# Patient Record
Sex: Female | Born: 1990 | Race: White | Hispanic: Yes | Marital: Single | Smoking: Never smoker
Health system: Southern US, Community
[De-identification: ages and names within clinical notes are randomized; demographics above are authoritative.]

## PROBLEM LIST (undated history)

## (undated) ENCOUNTER — Emergency Department: Discharge status: Absent without leave | Payer: BLUE CROSS/BLUE SHIELD

## (undated) DIAGNOSIS — D696 Thrombocytopenia, unspecified: Secondary | ICD-10-CM

## (undated) DIAGNOSIS — J45909 Unspecified asthma, uncomplicated: Secondary | ICD-10-CM

## (undated) HISTORY — DX: Thrombocytopenia, unspecified: D69.6

## (undated) HISTORY — PX: CYST REMOVAL HAND: SHX6279

## (undated) HISTORY — DX: Unspecified asthma, uncomplicated: J45.909

---

## 2013-10-22 ENCOUNTER — Encounter (HOSPITAL_COMMUNITY): Payer: Self-pay | Admitting: Emergency Medicine

## 2013-10-22 ENCOUNTER — Emergency Department (HOSPITAL_COMMUNITY)
Admission: EM | Admit: 2013-10-22 | Discharge: 2013-10-23 | Disposition: A | Discharge status: Home / Self Care | Attending: Emergency Medicine | Admitting: Emergency Medicine

## 2013-10-22 DIAGNOSIS — R51 Headache: Secondary | ICD-10-CM | POA: Diagnosis present

## 2013-10-22 DIAGNOSIS — Z880 Allergy status to penicillin: Secondary | ICD-10-CM | POA: Diagnosis not present

## 2013-10-22 DIAGNOSIS — G44039 Episodic paroxysmal hemicrania, not intractable: Secondary | ICD-10-CM | POA: Diagnosis not present

## 2013-10-22 MED ORDER — DIPHENHYDRAMINE HCL 50 MG/ML IJ SOLN
25.0000 mg | Freq: Once | INTRAMUSCULAR | Status: AC
  Administered 2013-10-23: 25 mg via INTRAMUSCULAR
  Filled 2013-10-22: qty 1

## 2013-10-22 MED ORDER — DEXAMETHASONE SODIUM PHOSPHATE 10 MG/ML IJ SOLN
10.0000 mg | Freq: Once | INTRAMUSCULAR | Status: AC
  Administered 2013-10-22: 10 mg via INTRAMUSCULAR
  Filled 2013-10-22: qty 1

## 2013-10-22 MED ORDER — METOCLOPRAMIDE HCL 5 MG/ML IJ SOLN
10.0000 mg | Freq: Once | INTRAMUSCULAR | Status: AC
  Administered 2013-10-22: 10 mg via INTRAMUSCULAR
  Filled 2013-10-22: qty 2

## 2013-10-22 NOTE — ED Provider Notes (Signed)
CSN: [id number]     Arrival date & time 10/22/13  2216 History   First MD Initiated Contact with Patient 10/22/13 2334     Chief Complaint  Patient presents with  . Headache     (Consider location/radiation/quality/duration/timing/severity/associated sxs/prior Treatment) HPI Comments: Patient presents with a headache. She states that 2 days ago someone was spraying Clorox next to her and some of the spray blew over onto her face. She states that she had some minor eye irritation but denies any current eye irritation. She denies any blurry vision, redness or drainage from her eyes. She complains of a constant throbbing headache since that time. It's across the front part of her head. She has had some similar pain with migraines in the past. She denies any nausea or vomiting. She denies any cough or congestion. She denies any dizziness. She has no numbness weakness or gait disturbance. She has no neck pain or fevers.  Patient is a 23 y.o. female presenting with headaches.  Headache Associated symptoms: no abdominal pain, no back pain, no congestion, no cough, no diarrhea, no dizziness, no pain, no fatigue, no fever, no nausea, no numbness, no photophobia and no vomiting     History reviewed. No pertinent past medical history. History reviewed. No pertinent past surgical history. No family history on file. History  Substance Use Topics  . Smoking status: Never Smoker   . Smokeless tobacco: Not on file  . Alcohol Use: Yes   OB History   Grav Para Term Preterm Abortions TAB SAB Ect Mult Living                 Review of Systems  Constitutional: Negative for fever, chills, diaphoresis and fatigue.  HENT: Negative for congestion, rhinorrhea and sneezing.   Eyes: Negative.  Negative for photophobia, pain, discharge, redness, itching and visual disturbance.  Respiratory: Negative for cough, chest tightness and shortness of breath.   Cardiovascular: Negative for chest pain and leg swelling.   Gastrointestinal: Negative for nausea, vomiting, abdominal pain, diarrhea and blood in stool.  Genitourinary: Negative for frequency, hematuria, flank pain and difficulty urinating.  Musculoskeletal: Negative for arthralgias and back pain.  Skin: Negative for rash.  Neurological: Positive for headaches. Negative for dizziness, speech difficulty, weakness and numbness.      Allergies  Penicillins  Home Medications   Prior to Admission medications   Not on File   BP 113/71  Pulse 76  Temp(Src) 98.3 F (36.8 C)  Resp 18  Ht  (1.448 m)  Wt 120 lb (54.432 kg)  BMI 25.96 kg/m2  SpO2 100% Physical Exam  Constitutional: She is oriented to person, place, and time. She appears well-developed and well-nourished.  HENT:  Head: Normocephalic and atraumatic.  Right Ear: External ear normal.  Left Ear: External ear normal.  Mouth/Throat: Oropharynx is clear and moist.  No erythema or rashes to the skin of her face  Eyes: Conjunctivae and EOM are normal. Pupils are equal, round, and reactive to light.  No eye redness or irritation. There is no drainage.  Neck: Normal range of motion. Neck supple.  No meningeal signs  Cardiovascular: Normal rate, regular rhythm and normal heart sounds.   Pulmonary/Chest: Effort normal and breath sounds normal. No respiratory distress. She has no wheezes. She has no rales. She exhibits no tenderness.  Abdominal: Soft. Bowel sounds are normal. There is no tenderness. There is no rebound and no guarding.  Musculoskeletal: Normal range of motion. She exhibits no edema.  Lymphadenopathy:    She has no cervical adenopathy.  Neurological: She is alert and oriented to person, place, and time. She has normal strength. No cranial nerve deficit or sensory deficit. GCS eye subscore is 4. GCS verbal subscore is 5. GCS motor subscore is 6.  Skin: Skin is warm and dry. No rash noted.  Psychiatric: She has a normal mood and affect.    ED Course  Procedures  (including critical care time) Labs Review Labs Reviewed - No data to display  Imaging Review No results found.   EKG Interpretation None      MDM   Final diagnoses:  Nonintractable paroxysmal hemicrania, unspecified chronicity pattern   Patient presents with a headache after having a Clorox exposure. She is well-appearing, sitting up on the side of the bed. She's in no apparent distress. I don't see any evidence of eye injury. There is no facial burns noted. Her headache seems consistent with her past migraines type headaches. There is no unusual symptoms that would be more suggestive of meningitis or subarachnoid hemorrhage. She was given a migraine cocktail. She is advised to return if her symptoms worsen.    Rolan Bucco, MD 10/22/13 [phone]

## 2013-10-22 NOTE — ED Notes (Signed)
The pt is c/o a headache for 2 days  She had clorox sprayed in her  vacinity while she was working she has had aheadache since then .  Hx of migraine headaches

## 2014-03-03 NOTE — L&D Delivery Note (Signed)
Operative Delivery Note At 9:04 AM a viable and healthy female was delivered via Vaginal, Vac[employer], operational).  Presentation: vertex; Position: Occiput,, Anterior; Station: +3.  Verbal consent: obtained from patient.  Risks and benefits discussed in detail.  Risks include, but are not limited to the risks of anesthesia, bleeding, infection, damage to maternal tissues, fetal cephalhematoma.  There is also the risk of inability to effect vaginal delivery of the head, or shoulder dystocia that cannot be resolved by established maneuvers, leading to the need for emergency cesarean section.  APGAR: 8, 9; weight 8 lb 1.6 oz (3675 g).   Placenta status: Intact, Manual removal.  Cord avulsed Cord: 3 vessels with the following complications: None.    Anesthesia: Epidural  Instruments: Kiwi Episiotomy: None Lacerations: 2nd degree Suture Repair: 3.0 vicryl rapide Est. Blood Loss (mL): 173cc  Mom to postpartum.  Baby to Couplet care / Skin to Skin.  Bovard-Stuckert, Bridget Hodges 09/28/2014, 9:30 AM  Desires circumcision for female infant including r/b/a - will proceed in office  Br/RI/A+/Tdap in PNC/Contra - Nexplanon

## 2014-04-07 LAB — OB RESULTS CONSOLE ANTIBODY SCREEN: Antibody Screen: NEGATIVE

## 2014-04-07 LAB — OB RESULTS CONSOLE GC/CHLAMYDIA
CHLAMYDIA, DNA PROBE: NEGATIVE
GC PROBE AMP, GENITAL: NEGATIVE

## 2014-04-07 LAB — OB RESULTS CONSOLE ABO/RH: RH Type: POSITIVE

## 2014-04-07 LAB — OB RESULTS CONSOLE RPR: RPR: NONREACTIVE

## 2014-04-07 LAB — OB RESULTS CONSOLE HEPATITIS B SURFACE ANTIGEN: Hepatitis B Surface Ag: NEGATIVE

## 2014-04-07 LAB — OB RESULTS CONSOLE HIV ANTIBODY (ROUTINE TESTING): HIV: NONREACTIVE

## 2014-04-07 LAB — OB RESULTS CONSOLE RUBELLA ANTIBODY, IGM: Rubella: IMMUNE

## 2014-05-28 ENCOUNTER — Emergency Department (HOSPITAL_COMMUNITY): Payer: Medicaid Other

## 2014-05-28 ENCOUNTER — Encounter (HOSPITAL_COMMUNITY): Payer: Self-pay | Admitting: *Deleted

## 2014-05-28 ENCOUNTER — Emergency Department (HOSPITAL_COMMUNITY)
Admission: EM | Admit: 2014-05-28 | Discharge: 2014-05-28 | Disposition: A | Discharge status: Home / Self Care | Payer: Medicaid Other | Attending: Emergency Medicine | Admitting: Emergency Medicine

## 2014-05-28 DIAGNOSIS — O468X2 Other antepartum hemorrhage, second trimester: Secondary | ICD-10-CM | POA: Diagnosis present

## 2014-05-28 DIAGNOSIS — Z880 Allergy status to penicillin: Secondary | ICD-10-CM | POA: Insufficient documentation

## 2014-05-28 DIAGNOSIS — O4692 Antepartum hemorrhage, unspecified, second trimester: Secondary | ICD-10-CM

## 2014-05-28 DIAGNOSIS — O200 Threatened abortion: Secondary | ICD-10-CM | POA: Insufficient documentation

## 2014-05-28 DIAGNOSIS — Z3A23 23 weeks gestation of pregnancy: Secondary | ICD-10-CM | POA: Diagnosis not present

## 2014-05-28 LAB — CBC WITH DIFFERENTIAL/PLATELET
BASOS PCT: 0 % (ref 0–1)
Basophils Absolute: 0 10*3/uL (ref 0.0–0.1)
Eosinophils Absolute: 0.4 10*3/uL (ref 0.0–0.7)
Eosinophils Relative: 4 % (ref 0–5)
HEMATOCRIT: 33.9 % — AB (ref 36.0–46.0)
Hemoglobin: 11.6 g/dL — ABNORMAL LOW (ref 12.0–15.0)
Lymphocytes Relative: 22 % (ref 12–46)
Lymphs Abs: 2.1 10*3/uL (ref 0.7–4.0)
MCH: 30.8 pg (ref 26.0–34.0)
MCHC: 34.2 g/dL (ref 30.0–36.0)
MCV: 89.9 fL (ref 78.0–100.0)
Monocytes Absolute: 0.7 10*3/uL (ref 0.1–1.0)
Monocytes Relative: 7 % (ref 3–12)
NEUTROS ABS: 6.3 10*3/uL (ref 1.7–7.7)
Neutrophils Relative %: 66 % (ref 43–77)
Platelets: 94 10*3/uL — ABNORMAL LOW (ref 150–400)
RBC: 3.77 MIL/uL — ABNORMAL LOW (ref 3.87–5.11)
RDW: 13.1 % (ref 11.5–15.5)
WBC: 9.5 10*3/uL (ref 4.0–10.5)

## 2014-05-28 LAB — SAMPLE TO BLOOD BANK

## 2014-05-28 LAB — COMPREHENSIVE METABOLIC PANEL
ALBUMIN: 3.1 g/dL — AB (ref 3.5–5.2)
ALT: 20 U/L (ref 0–35)
ANION GAP: 7 (ref 5–15)
AST: 21 U/L (ref 0–37)
Alkaline Phosphatase: 38 U/L — ABNORMAL LOW (ref 39–117)
BUN: 6 mg/dL (ref 6–23)
CHLORIDE: 105 mmol/L (ref 96–112)
CO2: 23 mmol/L (ref 19–32)
CREATININE: 0.45 mg/dL — AB (ref 0.50–1.10)
Calcium: 8.5 mg/dL (ref 8.4–10.5)
Glucose, Bld: 86 mg/dL (ref 70–99)
Potassium: 3.6 mmol/L (ref 3.5–5.1)
SODIUM: 135 mmol/L (ref 135–145)
TOTAL PROTEIN: 6.1 g/dL (ref 6.0–8.3)
Total Bilirubin: 0.3 mg/dL (ref 0.3–1.2)

## 2014-05-28 LAB — ABO/RH: ABO/RH(D): A POS

## 2014-05-28 LAB — FIBRINOGEN: Fibrinogen: 353 mg/dL (ref 204–475)

## 2014-05-28 LAB — LACTATE DEHYDROGENASE: LDH: 136 U/L (ref 94–250)

## 2014-05-28 LAB — PROTIME-INR
INR: 0.97 (ref 0.00–1.49)
PROTHROMBIN TIME: 13 s (ref 11.6–15.2)

## 2014-05-28 NOTE — Progress Notes (Signed)
To u/s, monitors removed

## 2014-05-28 NOTE — Discharge Instructions (Signed)
Vaginal Bleeding During Pregnancy, Second Trimester Bridget Hodges, your ultrasound results are below. See your OB/GYN physician within 3 days for follow-up. If symptoms worsen come back to the emergency department immediately. Thank you.  IMPRESSION: 1. Single live intrauterine pregnancy in breech presentation. Estimated gestational age [redacted] weeks 6 days for estimated date of delivery 09/25/2014. 2. Minimal fluid in the cervical canal with funneling of the cervix at 2.8 cm.    A small amount of bleeding (spotting) from the vagina is common in pregnancy. Sometimes the bleeding is normal and is not a problem, and sometimes it is a sign of something serious. Be sure to tell your doctor about any bleeding from your vagina right away. HOME CARE  Watch your condition for any changes.  Follow your doctor's instructions about how active you can be.  If you are on bed rest:  You may need to stay in bed and only get up to use the bathroom.  You may be allowed to do some activities.  If you need help, make plans for someone to help you.  Write down:  The number of pads you use each day.  How often you change pads.  How soaked (saturated) your pads are.  Do not use tampons.  Do not douche.  Do not have sex or orgasms until your doctor says it is okay.  If you pass any tissue from your vagina, save the tissue so you can show it to your doctor.  Only take medicines as told by your doctor.  Do not take aspirin because it can make you bleed.  Do not exercise, lift heavy weights, or do any activities that take a lot of energy and effort unless your doctor says it is okay.  Keep all follow-up visits as told by your doctor. GET HELP IF:   You bleed from your vagina.  You have cramps.  You have labor pains.  You have a fever that does not go away after you take medicine. GET HELP RIGHT AWAY IF:  You have very bad cramps in your back or belly (abdomen).  You have  contractions.  You have chills.  You pass large clots or tissue from your vagina.  You bleed more.  You feel light-headed or weak.  You pass out (faint).  You are leaking fluid or have a gush of fluid from your vagina. MAKE SURE YOU:  Understand these instructions.  Will watch your condition.  Will get help right away if you are not doing well or get worse. Document Released: 07/04/2013 Document Reviewed: 10/25/2012 [facility] Patient Information 2015 [place], [place]. This information is not intended to replace advice given to you by your health care provider. Make sure you discuss any questions you have with your health care provider.

## 2014-05-28 NOTE — Progress Notes (Signed)
Awaiting pt's return from u/s. Phone call to Dr Jackelyn KnifeMeisinger. Report given re: bleeding, cramping and u/s results. Pt is cleared obstetrically, call f/u with phone call to office on Monday or sooner PRN.

## 2014-05-28 NOTE — Progress Notes (Signed)
Pt to [facility] after BRB in toilet when urinating at about 1:00 am. Pt states it was more than spotting but less than a period flow. Pt reports some cramping over the past few days, but nothing that she could time, just here and there. Pt reports intercourse last night before bed. Denies LOF. Plan is for u/s and SVE by ED MD. Pt laying in bed in no apparent distress with no further vaginal bleeding.

## 2014-05-28 NOTE — ED Notes (Signed)
The pt  Has an ob doctor .Marland Kitchen. They have dopplered a heart beat many times.  The pts other pregnancy  Ended in an abortion

## 2014-05-28 NOTE — ED Provider Notes (Signed)
CSN: [id number]     Arrival date & time 05/28/14  0220 History  This chart was scribed for Bridget Crumble, MD by Tanda Rockers, ED Scribe. This patient was seen in room B16C/B16C and the patient's care was started at 3:24 AM.    Chief Complaint  Patient presents with  . Vaginal Bleeding   The history is provided by the patient. No language interpreter was used.     HPI Comments: Bridget Hodges is a 24 y.o. female who is 5 months pregnant presents to the Emergency Department complaining of vaginal bleeding that began earlier today around 1 AM. Pt states that when she urinated, she noticed blood in the toilet that was bright red. Pt was told by OBGYN to call him if she had bright red vaginal bleeding. Pt attempted to call OBGYN but he did not answer. Pt reports she feels as though she is having discharge but denies any sudden rush of fluid. She also complains of mild abdominal cramping and nausea. She had her first ultrasound on 05/01/14 which she states was normal. Pt has 2nd ultrasound appointment on 06/05/14. Pt mentions that she did have blood work done and notes that her platelets were low. She denies painful leg swelling, fever, chills, or any other symptoms.     History reviewed. No pertinent past medical history. History reviewed. No pertinent past surgical history. No family history on file. History  Substance Use Topics  . Smoking status: Never Smoker   . Smokeless tobacco: Not on file  . Alcohol Use: Yes   OB History    Gravida Para Term Preterm AB TAB SAB Ectopic Multiple Living   1              Review of Systems  10 Systems reviewed and all are negative for acute change except as noted in the HPI.    Allergies  Penicillins  Home Medications   Prior to Admission medications   Not on File   Triage Vitals: BP 98/72 mmHg  Pulse 76  Temp(Src) 97.7 F (36.5 C)  Resp 20  Ht  (1.448 m)  Wt 129 lb (58.514 kg)  BMI 27.91 kg/m2  SpO2 98%   Physical Exam   Constitutional: She is oriented to person, place, and time. She appears well-developed and well-nourished. No distress.  HENT:  Head: Normocephalic and atraumatic.  Nose: Nose normal.  Mouth/Throat: Oropharynx is clear and moist. No oropharyngeal exudate.  Eyes: Conjunctivae and EOM are normal. Pupils are equal, round, and reactive to light. No scleral icterus.  Neck: Normal range of motion. Neck supple. No JVD present. No tracheal deviation present. No thyromegaly present.  Cardiovascular: Normal rate, regular rhythm and normal heart sounds.  Exam reveals no gallop and no friction rub.   No murmur heard. Pulmonary/Chest: Effort normal and breath sounds normal. No respiratory distress. She has no wheezes. She exhibits no tenderness.  Abdominal: Soft. Bowel sounds are normal. She exhibits no distension and no mass. There is tenderness (Suprapubic tenderness to palpation. ). There is no rebound and no guarding.  Musculoskeletal: Normal range of motion. She exhibits no edema or tenderness.  Lymphadenopathy:    She has no cervical adenopathy.  Neurological: She is alert and oriented to person, place, and time. No cranial nerve deficit. She exhibits normal muscle tone.  Skin: Skin is warm and dry. No rash noted. No erythema. No pallor.  Nursing note and vitals reviewed.   ED Course  Procedures (including critical care time)  DIAGNOSTIC STUDIES: Oxygen Saturation is 98% on RA, normal by my interpretation.    COORDINATION OF CARE: 3:28 AM-Discussed treatment plan which includes CBC, CMP, pelvic exam with pt at bedside and pt agreed to plan.   Labs Review Labs Reviewed  CBC WITH DIFFERENTIAL/PLATELET - Abnormal; Notable for the following:    RBC 3.77 (*)    Hemoglobin 11.6 (*)    HCT 33.9 (*)    Platelets 94 (*)    All other components within normal limits  COMPREHENSIVE METABOLIC PANEL - Abnormal; Notable for the following:    Creatinine, Ser 0.45 (*)    Albumin 3.1 (*)    Alkaline  Phosphatase 38 (*)    All other components within normal limits  LACTATE DEHYDROGENASE  FIBRINOGEN  PROTIME-INR  SAMPLE TO BLOOD BANK  ABO/RH    Imaging Review [place] [facility] Limited  05/28/2014   CLINICAL DATA:  Vaginal bleeding in second trimester pregnancy.  EXAM: LIMITED OBSTETRIC ULTRASOUND AND TRANSVAGINAL OBSTETRIC ULTRASOUND  FINDINGS: Number of Fetuses: 1  Heart Rate:  131 bpm  Movement: Yes  Presentation: Breech  Placental Location: Anterior  Previa:  No  Amniotic Fluid (Subjective):  Within normal limits.  BPD:  5.5cm 22w  6d  MATERNAL FINDINGS:  Cervix: Small amount of fluid in the cervical canal. Cervix measures 3.8 cm, mild falling is noted at 2.8 cm.  Uterus/Adnexae:  No abnormality visualized.  IMPRESSION: 1. Single live intrauterine pregnancy in breech presentation. Estimated gestational age [redacted] weeks 6 days for estimated date of delivery 09/25/2014. 2. Minimal fluid in the cervical canal with funneling of the cervix at 2.8 cm.  This exam is performed on an emergent basis and does not comprehensively evaluate fetal size, dating, or anatomy; follow-up complete OB US should be considered if further fetal assessment is warranted.   Electronically Signed   By: Rubye OaksMelanie  Ehinger M.D.   On: 05/28/2014 05:33   [place] [facility] Transvaginal  05/28/2014   CLINICAL DATA:  Vaginal bleeding in second trimester pregnancy.  EXAM: LIMITED OBSTETRIC ULTRASOUND AND TRANSVAGINAL OBSTETRIC ULTRASOUND  FINDINGS: Number of Fetuses: 1  Heart Rate:  131 bpm  Movement: Yes  Presentation: Breech  Placental Location: Anterior  Previa:  No  Amniotic Fluid (Subjective):  Within normal limits.  BPD:  5.5cm 22w  6d  MATERNAL FINDINGS:  Cervix: Small amount of fluid in the cervical canal. Cervix measures 3.8 cm, mild falling is noted at 2.8 cm.  Uterus/Adnexae:  No abnormality visualized.  IMPRESSION: 1. Single live intrauterine pregnancy in breech presentation. Estimated gestational age [redacted] weeks 6 days for estimated date of delivery  09/25/2014. 2. Minimal fluid in the cervical canal with funneling of the cervix at 2.8 cm.  This exam is performed on an emergent basis and does not comprehensively evaluate fetal size, dating, or anatomy; follow-up complete OB US should be considered if further fetal assessment is warranted.   Electronically Signed   By: Rubye OaksMelanie  Ehinger M.D.   On: 05/28/2014 05:33     EKG Interpretation None      MDM   Final diagnoses:  None   patient since emergency department for vaginal bleeding in the setting of pregnancy. She denies having this earlier in her pregnancy. She has a hard he had a normal ultrasound. Will obtain a repeat ultrasound to evaluate for placenta previa or abruptio placenta. Patient appears hemodynamically stable and comfortable. She is not requesting pain or nausea medication. She states she has low platelets, platelets today are 96 will  obtain LDH and fibrinogen and INR to assure there is no TTP developing from this pregnancy.   TTP workup is neg.  Fetal heart tones have been monitored by OB and have been normal. Blood type is A+. At this time I find no emergent cause for her vaginal bleeding. Will call this a friend miscarriage, OB/GYN follow-up was advised within 3 days, her vital signs were within her normal limits and she is safe for discharge.  I personally performed the services described in this documentation, which was scribed in my presence. The recorded information has been reviewed and is accurate.     Bridget Crumble, MD 05/28/14 1630

## 2014-05-28 NOTE — Progress Notes (Signed)
Pt returned from u/s. Reviewed MD's advice. Pt will call office on Monday or earlier with any questions or concerns.

## 2014-05-28 NOTE — ED Notes (Signed)
The pt is having vaginal bleeding since yesterday  lmp oct .  edc July 26th.     She has been pregnant once previously

## 2014-06-07 ENCOUNTER — Telehealth: Payer: Self-pay | Admitting: Hematology

## 2014-06-07 NOTE — Telephone Encounter (Signed)
NEW PATIENT APPT-S/W PATIENT AND GAVE NP APPT FOR 05/05 @ 10:30 W/DR. FENG REFERRING DR. London PepperSTUCKETT,  DX-THROMBOCYTPENA

## 2014-06-07 NOTE — Telephone Encounter (Signed)
NEW PATIENT APPT-LEFT MESSAGE FOR PATIENT TO RETURN CALL TO SCHEDULE NP APPT. °

## 2014-07-06 ENCOUNTER — Telehealth: Payer: Self-pay | Admitting: Hematology

## 2014-07-06 ENCOUNTER — Ambulatory Visit: Admitting: Family

## 2014-07-06 ENCOUNTER — Encounter: Payer: Self-pay | Admitting: Hematology

## 2014-07-06 ENCOUNTER — Other Ambulatory Visit: Payer: BLUE CROSS/BLUE SHIELD

## 2014-07-06 ENCOUNTER — Ambulatory Visit: Payer: BLUE CROSS/BLUE SHIELD

## 2014-07-06 ENCOUNTER — Ambulatory Visit (HOSPITAL_BASED_OUTPATIENT_CLINIC_OR_DEPARTMENT_OTHER): Payer: BLUE CROSS/BLUE SHIELD | Admitting: Hematology

## 2014-07-06 DIAGNOSIS — D649 Anemia, unspecified: Secondary | ICD-10-CM

## 2014-07-06 DIAGNOSIS — D696 Thrombocytopenia, unspecified: Secondary | ICD-10-CM

## 2014-07-06 DIAGNOSIS — E039 Hypothyroidism, unspecified: Secondary | ICD-10-CM | POA: Insufficient documentation

## 2014-07-06 LAB — CBC & DIFF AND RETIC
BASO%: 0.5 % (ref 0.0–2.0)
Basophils Absolute: 0.1 10*3/uL (ref 0.0–0.1)
EOS%: 2.4 % (ref 0.0–7.0)
Eosinophils Absolute: 0.3 10*3/uL (ref 0.0–0.5)
HEMATOCRIT: 34 % — AB (ref 34.8–46.6)
HEMOGLOBIN: 11.6 g/dL (ref 11.6–15.9)
IMMATURE RETIC FRACT: 9.5 % (ref 1.60–10.00)
LYMPH%: 16.7 % (ref 14.0–49.7)
MCH: 30.9 pg (ref 25.1–34.0)
MCHC: 34.1 g/dL (ref 31.5–36.0)
MCV: 90.7 fL (ref 79.5–101.0)
MONO#: 0.6 10*3/uL (ref 0.1–0.9)
MONO%: 6.2 % (ref 0.0–14.0)
NEUT%: 74.2 % (ref 38.4–76.8)
NEUTROS ABS: 7.6 10*3/uL — AB (ref 1.5–6.5)
Platelets: 130 10*3/uL — ABNORMAL LOW (ref 145–400)
RBC: 3.75 10*6/uL (ref 3.70–5.45)
RDW: 12.9 % (ref 11.2–14.5)
Retic %: 1.22 % (ref 0.70–2.10)
Retic Ct Abs: 45.75 10*3/uL (ref 33.70–90.70)
WBC: 10.3 10*3/uL (ref 3.9–10.3)
lymph#: 1.7 10*3/uL (ref 0.9–3.3)

## 2014-07-06 LAB — FERRITIN CHCC: Ferritin: 7 ng/ml — ABNORMAL LOW (ref 9–269)

## 2014-07-06 LAB — COMPREHENSIVE METABOLIC PANEL (CC13)
ALBUMIN: 3 g/dL — AB (ref 3.5–5.0)
ALK PHOS: 62 U/L (ref 40–150)
ALT: 12 U/L (ref 0–55)
AST: 13 U/L (ref 5–34)
Anion Gap: 11 mEq/L (ref 3–11)
BUN: 5.2 mg/dL — ABNORMAL LOW (ref 7.0–26.0)
CHLORIDE: 106 meq/L (ref 98–109)
CO2: 21 mEq/L — ABNORMAL LOW (ref 22–29)
Calcium: 8.6 mg/dL (ref 8.4–10.4)
Creatinine: 0.6 mg/dL (ref 0.6–1.1)
GLUCOSE: 81 mg/dL (ref 70–140)
POTASSIUM: 3.7 meq/L (ref 3.5–5.1)
Sodium: 138 mEq/L (ref 136–145)
TOTAL PROTEIN: 6.6 g/dL (ref 6.4–8.3)

## 2014-07-06 LAB — MORPHOLOGY
PLT EST: DECREASED
RBC Comments: NORMAL

## 2014-07-06 LAB — LACTATE DEHYDROGENASE (CC13): LDH: 179 U/L (ref 125–245)

## 2014-07-06 LAB — IRON AND TIBC CHCC
%SAT: 7 % — ABNORMAL LOW (ref 21–57)
Iron: 36 ug/dL — ABNORMAL LOW (ref 41–142)
TIBC: 519 ug/dL — ABNORMAL HIGH (ref 236–444)
UIBC: 483 ug/dL — AB (ref 120–384)

## 2014-07-06 NOTE — Progress Notes (Signed)
Checked in new pt with no financial concerns at this time.  Pt has 2 insurances so financial assistance may not be needed.  ° °

## 2014-07-06 NOTE — Telephone Encounter (Signed)
Gave and printed appt sched and avs for pt for May and JUNE °

## 2014-07-06 NOTE — Progress Notes (Addendum)
[facility]  Telephone:([phone] Fax:([phone]) [phone]  Clinic New Consult Note   Patient Care Team: Pcp Not In System as PCP - General 07/06/2014  CHIEF COMPLAINTS/PURPOSE OF CONSULTATION:  Thrombocytopenia   HISTORY OF PRESENTING ILLNESS:  Bridget Hodges 24 y.o. female who is pregnant at 5828 weeks who is referred here by her OB because of thrombocytopenia.   Her only PMH was hypothyrodism, and she does not remember if she had CBC before her pregnancy. [place] for [place]. 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 On the first lab test after her pregnancy, she was found to have and plt 108K, hemoglobin 11.5, hematocrit 32.9%. White count was normal. She has some vaginal bleeding and had was seen at the ED on 05/28/2014, CBC showed hemoglobin 11.6, platelet 94K. her repeated CBC on 06/05/2014 showed Perry count 108 and hemoglobin 11.5.  She has occasional spontaneously mild nose bleeding in the past few month and gum bleeding which is chronic and stable. No other bleeding.  She has easy bruising since childhood.   She feels well overall. She has been gaining weight as expected from pregnancy. She denies any pain, shortness breast, abdominal discomfort, or other symptoms. She feels warm most of time, no fever, no chills or night sweats.   MEDICAL HISTORY:  History of hypothyroidism. She was treated with Synthroid in the past and is off now.  SURGICAL HISTORY: History reviewed. No pertinent past surgical history.  SOCIAL HISTORY: History   Social History  . Marital Status: Single    Spouse Name: N/A  . Number of Children: N/A  . Years of Education: N/A   Occupational History  . Not on file.   Social History Main Topics  . Smoking status: Never Smoker   . Smokeless tobacco: Not on file  . Alcohol Use: Yes     Comment: social drinker   . Drug Use: Yes    Special: Marijuana  . Sexual Activity: Not on file   Other Topics Concern  .  Not on file   Social History Narrative    FAMILY HISTORY: Family History  Problem Relation Age of Onset  . Cancer Maternal Aunt 35    breast cancer     ALLERGIES:  is allergic to penicillins.  MEDICATIONS:  Prenatal vitamin once daily  REVIEW OF SYSTEMS:   Constitutional: Denies fevers, chills or abnormal night sweats Eyes: Denies blurriness of vision, double vision or watery eyes Ears, nose, mouth, throat, and face: Denies mucositis or sore throat Respiratory: Denies cough, dyspnea or wheezes Cardiovascular: Denies palpitation, chest discomfort or lower extremity swelling Gastrointestinal:  Denies nausea, heartburn or change in bowel habits Skin: Denies abnormal skin rashes Lymphatics: Denies new lymphadenopathy or easy bruising Neurological:Denies numbness, tingling or new weaknesses Behavioral/Psych: Mood is stable, no new changes  All other systems were reviewed with the patient and are negative.  PHYSICAL EXAMINATION: ECOG PERFORMANCE STATUS: 0 - Asymptomatic  Filed Vitals:   07/06/14 1010  BP: 107/91  Pulse: 86  Temp: 98.2 F (36.8 C)  Resp: 18   Filed Weights   07/06/14 1010  Weight: 135 lb 9.6 oz (61.508 kg)    GENERAL:alert, no distress and comfortable SKIN: skin color, texture, turgor are normal, no rashes or significant lesions EYES: normal, conjunctiva are pink and non-injected, sclera clear OROPHARYNX:no exudate, no erythema and lips, buccal mucosa, and tongue normal  NECK: supple, thyroid normal size, non-tender, without nodularity LYMPH:  no palpable lymphadenopathy in the  cervical, axillary or inguinal LUNGS: clear to auscultation and percussion with normal breathing effort HEART: regular rate & rhythm and no murmurs and no lower extremity edema ABDOMEN:abdomen soft, non-tender and normal bowel sounds Musculoskeletal:no cyanosis of digits and no clubbing  PSYCH: alert & oriented x 3 with fluent speech NEURO: no focal motor/sensory  deficits  LABORATORY DATA:  I have reviewed the data as listed Lab Results  Component Value Date   WBC 9.5 05/28/2014   HGB 11.6* 05/28/2014   HCT 33.9* 05/28/2014   MCV 89.9 05/28/2014   PLT 94* 05/28/2014    Recent Labs  05/28/14 0302  NA 135  K 3.6  CL 105  CO2 23  GLUCOSE 86  BUN 6  CREATININE 0.45*  CALCIUM 8.5  GFRNONAA >90  GFRAA >90  PROT 6.1  ALBUMIN 3.1*  AST 21  ALT 20  ALKPHOS 38*  BILITOT 0.3    RADIOGRAPHIC STUDIES: I have personally reviewed the radiological images as listed and agreed with the findings in the report. No results found.  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   1. Thrombocytopenia  -I'll contact her previous PCP Dr. Quincy CarnesWilliams, [place] [phone], to see if she had old CBC records. -We discussed the possible etiology of her thrombocytopenia, which includes ITP or pregnancy related thrombocytopenia. Her prior labwork in the ED on 05/28/2014 showed normal fibrinogen, normal INR, normal liver and kidney function, no evidence of DIC or HELP syndrome -I'll check her CBC and CMP again today, also obtain ANA, RA, ANCA, hepatitis B/C, HIV, and LDH  -We'll monitor her CBC closely, every 2 weeks for the next 2 months, then weekly afterwards until her delivery. -We discussed the treatment options for ITP, such as steroids. She would not need treatment unless herpetic count drops below 50k or if she has clinical bleeding. -We discussed the possibility of her child having thrombocytopenia at birth, which needs to be monitored -Discussed the risk of bleeding with thrombocytopenia, and avoid injury or fall.  2 history of hypothyroidism -We'll check TSH.   Plan: -I will see her back in 4 weeks, repeat CBC in 2 weeks.      Orders Placed This Encounter  Procedures  . CBC  & Diff and Retic    Standing Status: Future     Number of Occurrences: 1     Standing Expiration Date: 07/06/2015  . Morphology    Standing Status: Future     Number of Occurrences: 1     Standing Expiration Date: 07/06/2015  . Comprehensive metabolic panel (Cmet) - [facility]    Standing Status: Future     Number of Occurrences: 1     Standing Expiration Date: 07/06/2015  . Lactate dehydrogenase (LDH) - [facility]    Standing Status: Future     Number of Occurrences: 1     Standing Expiration Date: 07/06/2015  . Ferritin    Standing Status: Future     Number of Occurrences: 1     Standing Expiration Date: 07/06/2015  . Iron and TIBC [facility]    Standing Status: Future     Number of Occurrences: 1     Standing Expiration Date: 07/06/2015  . Folate RBC    Standing Status: Future     Number of Occurrences: 1     Standing Expiration Date: 07/06/2015  . Vitamin B12  Standing Status: Future     Number of Occurrences: 1     Standing Expiration Date: 07/06/2015  . ANCA Screen Reflex Titer    Standing Status: Future     Number of Occurrences: 1     Standing Expiration Date: 07/06/2015  . ANA    Standing Status: Future     Number of Occurrences: 1     Standing Expiration Date: 07/06/2015  . Rheumatoid factor    Standing Status: Future     Number of Occurrences: 1     Standing Expiration Date: 07/06/2015  . Hepatitis B surface antibody    Standing Status: Future     Number of Occurrences: 1     Standing Expiration Date: 07/06/2015  . Hepatitis C antibody    Standing Status: Future     Number of Occurrences: 1     Standing Expiration Date: 07/06/2015  . HIV antibody (with reflex)  . APTT    Standing Status: Future     Number of Occurrences: 1     Standing Expiration Date: 07/06/2015  . Lactate dehydrogenase    Prior PCP: Dr. Quincy Carnes [phone]   All questions were answered. The patient knows to call the clinic with any problems, questions or concerns. I spent 40 minutes counseling the patient face  to face. The total time spent in the appointment was 50 minutes and more than 50% was on counseling.     Malachy Mood, MD 07/06/2014 11:44 AM   Addendum I received her outside medical records from Dr. Mayford Knife, her CBC from 10/03/2012 showed WBC 6.2, hemoglobin 13.1, hematocrit 39.1, platelet count 126K.   Malachy Mood  07/25/2014

## 2014-07-07 ENCOUNTER — Telehealth: Payer: Self-pay

## 2014-07-07 ENCOUNTER — Telehealth: Payer: Self-pay | Admitting: Hematology

## 2014-07-07 LAB — ANCA SCREEN W REFLEX TITER
Atypical p-ANCA Screen: NEGATIVE
P-ANCA SCREEN: NEGATIVE
c-ANCA Screen: NEGATIVE

## 2014-07-07 LAB — APTT: aPTT: 29 seconds (ref 24–37)

## 2014-07-07 LAB — FOLATE RBC: RBC FOLATE: 1155 ng/mL (ref >280)

## 2014-07-07 LAB — ANA: ANA: NEGATIVE

## 2014-07-07 LAB — HIV ANTIBODY (ROUTINE TESTING W REFLEX): HIV 1&2 Ab, 4th Generation: NONREACTIVE

## 2014-07-07 LAB — HEPATITIS B SURFACE ANTIBODY,QUALITATIVE: Hep B S Ab: POSITIVE — AB

## 2014-07-07 LAB — RHEUMATOID FACTOR: RHEUMATOID FACTOR: 15 [IU]/mL — AB (ref <=14)

## 2014-07-07 LAB — VITAMIN B12: Vitamin B-12: 477 pg/mL (ref 211–911)

## 2014-07-07 LAB — HEPATITIS C ANTIBODY: HCV Ab: NEGATIVE

## 2014-07-07 NOTE — Telephone Encounter (Signed)
Faxed pt ROI form to Dr Quincy CarnesSonia Williams [phone]

## 2014-07-07 NOTE — Telephone Encounter (Signed)
Pt called requesting lab results from 5/5.

## 2014-07-07 NOTE — Telephone Encounter (Signed)
I called her back and left a message: informed her plt was 130, and she has iron deficiency. I told her to take iron pill twice daily. Watch for constipation when on iron pill. The rest of the lab were unremarkable.   Malachy MoodFeng, Donyel Nester  07/07/2014

## 2014-07-20 ENCOUNTER — Other Ambulatory Visit (HOSPITAL_BASED_OUTPATIENT_CLINIC_OR_DEPARTMENT_OTHER): Payer: BLUE CROSS/BLUE SHIELD

## 2014-07-20 DIAGNOSIS — D649 Anemia, unspecified: Secondary | ICD-10-CM

## 2014-07-20 DIAGNOSIS — D696 Thrombocytopenia, unspecified: Secondary | ICD-10-CM | POA: Diagnosis not present

## 2014-07-20 DIAGNOSIS — E039 Hypothyroidism, unspecified: Secondary | ICD-10-CM | POA: Diagnosis not present

## 2014-07-20 LAB — CBC & DIFF AND RETIC
BASO%: 0.7 % (ref 0.0–2.0)
Basophils Absolute: 0.1 10*3/uL (ref 0.0–0.1)
EOS ABS: 0.3 10*3/uL (ref 0.0–0.5)
EOS%: 2.6 % (ref 0.0–7.0)
HEMATOCRIT: 34.3 % — AB (ref 34.8–46.6)
HGB: 11.5 g/dL — ABNORMAL LOW (ref 11.6–15.9)
Immature Retic Fract: 8.1 % (ref 1.60–10.00)
LYMPH%: 14.2 % (ref 14.0–49.7)
MCH: 30.1 pg (ref 25.1–34.0)
MCHC: 33.6 g/dL (ref 31.5–36.0)
MCV: 89.4 fL (ref 79.5–101.0)
MONO#: 0.9 10*3/uL (ref 0.1–0.9)
MONO%: 6.8 % (ref 0.0–14.0)
NEUT%: 75.7 % (ref 38.4–76.8)
NEUTROS ABS: 9.8 10*3/uL — AB (ref 1.5–6.5)
Platelets: 134 10*3/uL — ABNORMAL LOW (ref 145–400)
RBC: 3.84 10*6/uL (ref 3.70–5.45)
RDW: 13.1 % (ref 11.2–14.5)
Retic %: 1.37 % (ref 0.70–2.10)
Retic Ct Abs: 52.61 10*3/uL (ref 33.70–90.70)
WBC: 12.9 10*3/uL — AB (ref 3.9–10.3)
lymph#: 1.8 10*3/uL (ref 0.9–3.3)

## 2014-07-21 LAB — TSH CHCC: TSH: 1.539 m[IU]/L (ref 0.308–3.960)

## 2014-08-02 ENCOUNTER — Encounter: Payer: Self-pay | Admitting: Family

## 2014-08-02 ENCOUNTER — Ambulatory Visit (INDEPENDENT_AMBULATORY_CARE_PROVIDER_SITE_OTHER): Payer: BLUE CROSS/BLUE SHIELD | Admitting: Family

## 2014-08-02 VITALS — BP 108/58 | HR 105 | Temp 98.0°F | Resp 18 | Ht <= 58 in | Wt 136.4 lb

## 2014-08-02 DIAGNOSIS — J4520 Mild intermittent asthma, uncomplicated: Secondary | ICD-10-CM

## 2014-08-02 DIAGNOSIS — Z331 Pregnant state, incidental: Secondary | ICD-10-CM | POA: Diagnosis not present

## 2014-08-02 DIAGNOSIS — Z0000 Encounter for general adult medical examination without abnormal findings: Secondary | ICD-10-CM | POA: Diagnosis not present

## 2014-08-02 DIAGNOSIS — Z3490 Encounter for supervision of normal pregnancy, unspecified, unspecified trimester: Secondary | ICD-10-CM | POA: Insufficient documentation

## 2014-08-02 DIAGNOSIS — J45909 Unspecified asthma, uncomplicated: Secondary | ICD-10-CM | POA: Insufficient documentation

## 2014-08-02 MED ORDER — ALBUTEROL SULFATE HFA 108 (90 BASE) MCG/ACT IN AERS: 2.0000 | INHALATION_SPRAY | Freq: Four times a day (QID) | RESPIRATORY_TRACT | Status: AC | PRN

## 2014-08-02 NOTE — Progress Notes (Signed)
Pre visit review using our clinic review tool, if applicable. No additional management support is needed unless otherwise documented below in the visit note. 

## 2014-08-02 NOTE — Assessment & Plan Note (Signed)
Refill albuterol per patient request.

## 2014-08-02 NOTE — Patient Instructions (Addendum)
Thank you for choosing [facility].  Summary/Instructions:  Your prescription(s) have been submitted to your pharmacy or been printed and provided for you. Please take as directed and contact our office if you believe you are having problem(s) with the medication(s) or have any questions.  Please continue to take your prenatal vitamins and follow up with Obstetrics as scheduled.    Health Maintenance - 48-22 Years Old SCHOOL PERFORMANCE After high school, you may attend college or technical or vocational school, enroll in the [employer], or enter the workforce. PHYSICAL, SOCIAL, AND EMOTIONAL DEVELOPMENT  One hour of regular physical activity daily is recommended. Continue to participate in sports.  Develop your own interests and consider community service or volunteerism.  Make decisions about college and work plans.  Throughout these years, you should assume responsibility for your own health care. Increasing independence is important for you.  You may be exploring your sexual identity. Understand that you should never be in a situation that makes you feel uncomfortable, and tell your partner if you do not want to engage in sexual activity.  Body image may become important to you. Be mindful that eating disorders can develop at this time. Talk to your parents or other caregivers if you have concerns about body image, weight gain, or losing weight.  You may notice mood disturbances, depression, anxiety, attention problems, or trouble with alcohol. Talk to your health care provider if you have concerns about mental illness.  Set limits for yourself and talk with your parents or other caregivers about independent decision making.  Handle conflict without physical violence.  Avoid loud noises which may impair hearing.  Limit television and computer time to 2 hours each day. Individuals who engage in excessive inactivity are more likely to become overweight. RECOMMENDED  IMMUNIZATIONS  Influenza vaccine.  All adults should be immunized every year.  All adults, including pregnant women and people with hives-only allergy to eggs, can receive the inactivated influenza (IIV) vaccine.  Adults aged 18-49 years can receive the recombinant influenza (RIV) vaccine. The RIV vaccine does not contain any egg protein.  Tetanus, diphtheria, and acellular pertussis (Td, Tdap) vaccine.  Pregnant women should receive 1 dose of Tdap vaccine during each pregnancy. The dose should be obtained regardless of the length of time since the last dose. Immunization is preferred during the 27th to 36th week of gestation.  An adult who has not previously received Tdap or who does not know his or her vaccine status should receive 1 dose of Tdap. This initial dose should be followed by tetanus and diphtheria toxoids (Td) booster doses every 10 years.  Adults with an unknown or incomplete history of completing a 3-dose immunization series with Td-containing vaccines should begin or complete a primary immunization series including a Tdap dose.  Adults should receive a Td booster every 10 years.  Varicella vaccine.  An adult without evidence of immunity to varicella should receive 2 doses or a second dose if he or she has previously received 1 dose.  Pregnant females who do not have evidence of immunity should receive the first dose after pregnancy. This first dose should be obtained before leaving the health care facility. The second dose should be obtained 4-8 weeks after the first dose.  Human papillomavirus (HPV) vaccine.  Females aged 13-26 years who have not received the vaccine previously should obtain the 3-dose series.  The vaccine is not recommended for pregnant females. However, pregnancy testing is not needed before receiving a dose. If  a female is found to be pregnant after receiving a dose, no treatment is needed. In that case, the remaining doses should be delayed until  after the pregnancy.  Males aged 83-21 years who have not received the vaccine previously should receive the 3-dose series. Males aged 22-26 years may be immunized.  Immunization is recommended through the age of 56 years for any female who has sex with males and did not get any or all doses earlier.  Immunization is recommended for any person with an immunocompromised condition through the age of 85 years if he or she did not get any or all doses earlier.  During the 3-dose series, the second dose should be obtained 4-8 weeks after the first dose. The third dose should be obtained 24 weeks after the first dose and 16 weeks after the second dose.  Measles, mumps, and rubella (MMR) vaccine.  Adults born in [birth year] or later should have 1 or more doses of MMR vaccine unless there is a contraindication to the vaccine or there is laboratory evidence of immunity to each of the three diseases.  A routine second dose of MMR vaccine should be obtained at least 28 days after the first dose for students attending postsecondary schools, health care workers, and international travelers.  For females of childbearing age, rubella immunity should be determined. If there is no evidence of immunity, females who are not pregnant should be vaccinated. If there is no evidence of immunity, females who are pregnant should delay immunization until after pregnancy.  Pneumococcal 13-valent conjugate (PCV13) vaccine.  When indicated, a person who is uncertain of his or her immunization history and has no record of immunization should receive the PCV13 vaccine.  An adult aged 69 years or older who has certain medical conditions and has not been previously immunized should receive 1 dose of PCV13 vaccine. This PCV13 should be followed with a dose of pneumococcal polysaccharide (PPSV23) vaccine. The PPSV23 vaccine dose should be obtained at least 8 weeks after the dose of PCV13 vaccine.  An adult aged 50 years or older who has  certain medical conditions and previously received 1 or more doses of PPSV23 vaccine should receive 1 dose of PCV13. The PCV13 vaccine dose should be obtained 1 or more years after the last PPSV23 vaccine dose.  Pneumococcal polysaccharide (PPSV23) vaccine.  When PCV13 is also indicated, PCV13 should be obtained first.  An adult younger than age 1 years who has certain medical conditions should be immunized.  Any person who resides in a long-term care facility should be immunized.  An adult smoker should be immunized.  People with an immunocompromised condition and certain other conditions should receive both PCV13 and PPSV23 vaccines.  People with human immunodeficiency virus (HIV) infection should be immunized as soon as possible after diagnosis.  Immunization during chemotherapy or radiation therapy should be avoided.  Routine use of PPSV23 vaccine is not recommended for American Indians, [place] Natives, or people younger than 65 years unless there are medical conditions that require PPSV23 vaccine.  When indicated, people who have unknown immunization and have no record of immunization should receive PPSV23 vaccine.  One-time revaccination 5 years after the first dose of PPSV23 is recommended for people aged 19-64 years who have chronic kidney failure, nephrotic syndrome, asplenia, or immunocompromised conditions.  Meningococcal vaccine.  Adults with asplenia or persistent complement component deficiencies should receive 2 doses of quadrivalent meningococcal conjugate (MenACWY-D) vaccine. The doses should be obtained at least 2 months apart.  Microbiologists working with certain meningococcal bacteria, [place] recruits, people at risk during an outbreak, and people who travel to or live in countries with a high rate of meningitis should be immunized.  A first-year college student up through age 33 years who is living in a residence hall should receive a dose if he or she did not  receive a dose on or after his or her 16th birthday.  Adults who have certain high-risk conditions should receive one or more doses of vaccine.  Hepatitis A vaccine.  Adults who wish to be protected from this disease, have certain high-risk conditions, work with hepatitis A-infected animals, work in hepatitis A research labs, or travel to or work in countries with a high rate of hepatitis A should be immunized.  Adults who were previously unvaccinated and who anticipate close contact with an international adoptee during the first 60 days after arrival in the [place] from a country with a high rate of hepatitis A should be immunized.  Hepatitis B vaccine.  Adults who wish to be protected from this disease, have certain high-risk conditions, may be exposed to blood or other infectious body fluids, are household contacts or sex partners of hepatitis B positive people, are clients or workers in certain care facilities, or travel to or work in countries with a high rate of hepatitis B should be immunized.  Haemophilus influenzae type b (Hib) vaccine.  A previously unvaccinated person with asplenia or sickle cell disease or having a scheduled splenectomy should receive 1 dose of Hib vaccine.  Regardless of previous immunization, a recipient of a hematopoietic stem cell transplant should receive a 3-dose series 6-12 months after his or her successful transplant.  Hib vaccine is not recommended for adults with HIV infection. TESTING  Annual screening for vision and hearing problems is recommended. Vision should be screened at least once between 60-11 years of age.  You may be screened for anemia or tuberculosis.  You should have a blood test to check for high cholesterol.  You should be screened for alcohol and drug use.  If you are sexually active, you may be screened for sexually transmitted infections (STIs), pregnancy, or HIV. You should be screened for STIs if:  Your sexual  activity has changed since the last screening test, and you are at an increased risk for chlamydia or gonorrhea. Ask your health care provider if you are at risk.  If you are at an increased risk for hepatitis B, you should be screened for this virus. You are considered at high risk for hepatitis B if you:  Were born in a country where hepatitis B occurs often. Talk with your health care provider about which countries are considered high risk.  Have parents who were born in a high-risk country and have not received a shot to protect against hepatitis B (hepatitis B vaccine).  Have HIV or AIDS.  Use needles to inject street drugs.  Live with or have sex with someone who has hepatitis B.  Are a man who has sex with other men (MSM).  Get hemodialysis treatment.  Take certain medicines for conditions like cancer, organ transplantation, or autoimmune conditions. NUTRITION   You should:  Have three servings of low-fat milk and dairy products daily. If you do not drink milk or consume dairy products, you should eat calcium-enriched foods, such as juice, bread, or cereal. Dark, leafy greens or canned fish are alternate sources of calcium.  Drink plenty of water. Fruit juice should be  limited to 8-12 oz (240-360 mL) each day. Sugary beverages and sodas should be avoided.  Avoid eating foods high in fat, salt, or sugar, such as chips, candy, and cookies.  Avoid fast foods and limit eating out at restaurants.  Try not to skip meals, especially breakfast. You should eat a variety of vegetables, fruits, and lean meats.  Eat meals together as a family whenever possible. ORAL HEALTH Brush your teeth twice a day and floss at least once a day. You should have two dental exams a year.  SKIN CARE You should wear sunscreen when out in the sun. TALK TO SOMEONE ABOUT:  Precautions against pregnancy, contraception, and sexually transmitted infections.  Taking a prescription medicine daily to  prevent HIV infection if you are at risk of being infected with HIV. This is called preexposure prophylaxis (PrEP). You are at risk if you:  Are a female who has sex with other males (MSM).  Are heterosexual and sexually active with more than one partner.  Take drugs by injection.  Are sexually active with a partner who has HIV.  Whether you are at high risk of being infected with HIV. If you choose to begin PrEP, you should first be tested for HIV. You should then be tested every 3 months for as long as you are taking PrEP.  Drug, tobacco, and alcohol use among your friends or at friends' homes. Smoking tobacco or marijuana and taking drugs have health consequences and may impact your brain development.  Appropriate use of over-the-counter or prescription medicines.  Driving guidelines and riding with friends.  The risks of drinking and driving or boating. Call someone if you have been drinking or using drugs and need a ride. WHAT'S NEXT? Visit your pediatrician or family physician once a year. By young adulthood, you should transition from your pediatrician to a family physician or internal medicine specialist. If you are a female and are sexually active, you may want to begin annual physical exams with a gynecologist. Document Released: 05/15/2006 Document Revised: 02/22/2013 Document Reviewed: 06/04/2006 [facility] Patient Information 2015 [place], [place]. This information is not intended to replace advice given to you by your health care provider. Make sure you discuss any questions you have with your health care provider.

## 2014-08-02 NOTE — Assessment & Plan Note (Addendum)
1) Anticipatory Guidance: Discussed importance of wearing a seatbelt while driving and not texting while driving; changing batteries in smoke detector at least once annually; wearing suntan lotion when outside; eating a balanced and moderate diet; getting physical activity at least 30 minutes per day.  2) Immunizations / Screenings / Labs:  All immunizations are up-to-date per recommendations. All screenings are up-to-date per recommendations. Declines blood work today as she is [facility] for thrombocytopenia and through Obstetrics as she is [redacted] weeks pregnant.  Overall well exam. Minimal risk factors for cardiovascular disease. Continue current healthy lifestyle trends. Reports pregnancy is progressing well per OB. Referral for OB placed as required for insurance. Follow-up office visit as needed. Follow-up prevention exam in 1 year.

## 2014-08-02 NOTE — Progress Notes (Signed)
Subjective:    Patient ID: Bridget Hodges, female    DOB: 1990-04-05, 24 y.o.   MRN: [id number]  Chief Complaint  Patient presents with  . Establish Care    CPE     HPI:  Bridget Hodges is a 24 y.o. female who presents today for an annual wellness visit.   1) Health Maintenance -   Diet - Averaging 3 meals per day with snacks in between.  Exercise - Walks a lot and takes the bus, slightly limited with pregnancy   2) Preventative Exams / Immunizations:  Dental -- Up to date Vision -- Up to date   Health Maintenance  Topic Date Due  . TETANUS/TDAP  11/11/2011  . INFLUENZA VACCINE  10/02/2014  . PAP SMEAR  05/15/2017  . HIV Screening  Completed      There is no immunization history on file for this patient.  Allergies  Allergen Reactions  . Penicillins      No outpatient prescriptions prior to visit.   No facility-administered medications prior to visit.    Past Medical History  Diagnosis Date  . Asthma   . Thrombocytopenia      Past Surgical History  Procedure Laterality Date  . Cyst removal hand Left      Family History  Problem Relation Age of Onset  . Cancer Maternal Aunt 35    breast cancer   . Healthy Mother   . Healthy Father   . Healthy Maternal Grandmother   . Healthy Maternal Grandfather   . Healthy Paternal Grandmother   . Healthy Paternal Grandfather      History   Social History  . Marital Status: Single    Spouse Name: N/A  . Number of Children: 0  . 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 Smoking status: Never Smoker   . Smokeless tobacco: Never Used  . Alcohol Use: Yes     Comment: social drinker   . Drug Use: Yes    Special: Marijuana  . Sexual Activity: Not on file   Other Topics Concern  . Not on file   Social History Narrative   Fun: Sleep    Denies religious beliefs effecting healthcare.     Review of Systems  Constitutional:  Denies fever, chills, fatigue, or significant weight gain/loss. HENT: Head: Denies headache or neck pain Ears: Denies changes in hearing, ringing in ears, earache, drainage Nose: Denies discharge, stuffiness, itching, nosebleed, sinus pain Throat: Denies sore throat, hoarseness, dry mouth, sores, thrush Eyes: Denies loss/changes in vision, pain, redness, blurry/double vision, flashing lights Cardiovascular: Denies chest pain/discomfort, tightness, palpitations, shortness of breath with activity, difficulty lying down, swelling, sudden awakening with shortness of breath Respiratory: Denies shortness of breath, cough, sputum production, wheezing Gastrointestinal: Denies dysphasia, heartburn, change in appetite, nausea, change in bowel habits, rectal bleeding, constipation, diarrhea, yellow skin or eyes Genitourinary: Denies frequency, urgency, burning/pain, blood in urine, incontinence, change in urinary strength. Musculoskeletal: Denies muscle/joint pain, stiffness, back pain, redness or swelling of joints, trauma Skin: Denies rashes, lumps, itching, dryness, color changes, or hair/nail changes Neurological: Denies dizziness, fainting, seizures, weakness, numbness, tingling, tremor Psychiatric - Denies nervousness, stress, depression or memory loss Endocrine: Denies heat or cold intolerance, sweating, frequent urination, excessive thirst, changes in appetite Hematologic: Denies ease of bruising or bleeding     Objective:    BP 108/58 mmHg  Pulse 105  Temp(Src)  98 F (36.7 C) (Oral)  Resp 18  Ht  (1.448 m)  Wt 136 lb 6.4 oz (61.871 kg)  BMI 29.51 kg/m2  SpO2 98% Nursing note and vital signs reviewed.  Physical Exam  Constitutional: She is oriented to person, place, and time. She appears well-developed and well-nourished.  HENT:  Head: Normocephalic.  Right Ear: Hearing, tympanic membrane, external ear and ear canal normal.  Left Ear: Hearing, tympanic membrane, external ear  and ear canal normal.  Nose: Nose normal.  Mouth/Throat: Uvula is midline, oropharynx is clear and moist and mucous membranes are normal.  Eyes: Conjunctivae and EOM are normal. Pupils are equal, round, and reactive to light.  Neck: Neck supple. No JVD present. No tracheal deviation present. No thyromegaly present.  Cardiovascular: Normal rate, regular rhythm, normal heart sounds and intact distal pulses.   Pulmonary/Chest: Effort normal and breath sounds normal.  Abdominal: Soft. Bowel sounds are normal. She exhibits no distension and no mass. There is no tenderness. There is no rebound and no guarding.  Musculoskeletal: Normal range of motion. She exhibits no edema or tenderness.  Lymphadenopathy:    She has no cervical adenopathy.  Neurological: She is alert and oriented to person, place, and time. She has normal reflexes. No cranial nerve deficit. She exhibits normal muscle tone. Coordination normal.  Skin: Skin is warm and dry.  Psychiatric: She has a normal mood and affect. Her behavior is normal. Judgment and thought content normal.       Assessment & Plan:    Problem List Items Addressed This Visit      Respiratory   Asthma    Refill albuterol per patient request.      Relevant Medications   albuterol (PROVENTIL HFA;VENTOLIN HFA) 108 (90 BASE) MCG/ACT inhaler     Other   Routine general medical examination at a health care facility - Primary    1) Anticipatory Guidance: Discussed importance of wearing a seatbelt while driving and not texting while driving; changing batteries in smoke detector at least once annually; wearing suntan lotion when outside; eating a balanced and moderate diet; getting physical activity at least 30 minutes per day.  2) Immunizations / Screenings / Labs:  All immunizations are up-to-date per recommendations. All screenings are up-to-date per recommendations. Declines blood work today as she is being treated at the [facility] for  thrombocytopenia and through Obstetrics as she is [redacted] weeks pregnant.  Overall well exam. Minimal risk factors for cardiovascular disease. Continue current healthy lifestyle trends. Reports pregnancy is progressing well per OB. Referral for OB placed as required for insurance. Follow-up office visit as needed. Follow-up prevention exam in 1 year.      Pregnant   Relevant Orders   Ambulatory referral to Obstetrics / Gynecology

## 2014-08-03 ENCOUNTER — Other Ambulatory Visit (HOSPITAL_BASED_OUTPATIENT_CLINIC_OR_DEPARTMENT_OTHER): Payer: BLUE CROSS/BLUE SHIELD

## 2014-08-03 ENCOUNTER — Encounter: Payer: Self-pay | Admitting: Hematology

## 2014-08-03 ENCOUNTER — Ambulatory Visit (HOSPITAL_BASED_OUTPATIENT_CLINIC_OR_DEPARTMENT_OTHER): Payer: BLUE CROSS/BLUE SHIELD | Admitting: Hematology

## 2014-08-03 ENCOUNTER — Telehealth: Payer: Self-pay | Admitting: Hematology

## 2014-08-03 VITALS — BP 119/68 | HR 93 | Temp 98.2°F | Resp 18 | Ht <= 58 in | Wt 136.9 lb

## 2014-08-03 DIAGNOSIS — Z3A28 28 weeks gestation of pregnancy: Secondary | ICD-10-CM

## 2014-08-03 DIAGNOSIS — O99119 Other diseases of the blood and blood-forming organs and certain disorders involving the immune mechanism complicating pregnancy, unspecified trimester: Secondary | ICD-10-CM

## 2014-08-03 DIAGNOSIS — D696 Thrombocytopenia, unspecified: Secondary | ICD-10-CM

## 2014-08-03 DIAGNOSIS — E039 Hypothyroidism, unspecified: Secondary | ICD-10-CM

## 2014-08-03 DIAGNOSIS — D649 Anemia, unspecified: Secondary | ICD-10-CM

## 2014-08-03 LAB — CBC & DIFF AND RETIC
BASO%: 0.3 % (ref 0.0–2.0)
Basophils Absolute: 0 10*3/uL (ref 0.0–0.1)
EOS%: 1.9 % (ref 0.0–7.0)
Eosinophils Absolute: 0.2 10*3/uL (ref 0.0–0.5)
HCT: 34.4 % — ABNORMAL LOW (ref 34.8–46.6)
HGB: 11.6 g/dL (ref 11.6–15.9)
Immature Retic Fract: 13.9 % — ABNORMAL HIGH (ref 1.60–10.00)
LYMPH%: 14.6 % (ref 14.0–49.7)
MCH: 29.7 pg (ref 25.1–34.0)
MCHC: 33.7 g/dL (ref 31.5–36.0)
MCV: 88.2 fL (ref 79.5–101.0)
MONO#: 0.9 10*3/uL (ref 0.1–0.9)
MONO%: 7.6 % (ref 0.0–14.0)
NEUT#: 8.8 10*3/uL — ABNORMAL HIGH (ref 1.5–6.5)
NEUT%: 75.6 % (ref 38.4–76.8)
Platelets: 121 10*3/uL — ABNORMAL LOW (ref 145–400)
RBC: 3.9 10*6/uL (ref 3.70–5.45)
RDW: 13.1 % (ref 11.2–14.5)
RETIC %: 1.54 % (ref 0.70–2.10)
Retic Ct Abs: 60.06 10*3/uL (ref 33.70–90.70)
WBC: 11.6 10*3/uL — ABNORMAL HIGH (ref 3.9–10.3)
lymph#: 1.7 10*3/uL (ref 0.9–3.3)

## 2014-08-03 LAB — TECHNOLOGIST REVIEW

## 2014-08-03 NOTE — Telephone Encounter (Signed)
Gave adn printed appt sched and avs for June and July  °

## 2014-08-03 NOTE — Progress Notes (Signed)
[facility]  Telephone:([phone]) [phone] Fax:([phone]  Clinic New Consult Note   Patient Care Team: Veryl Speak, FNP as PCP - General (Family Medicine) Lavina Hamman, MD as Consulting Physician (Obstetrics and Gynecology) 08/03/2014  CHIEF COMPLAINTS/PURPOSE OF CONSULTATION:  Chronic ITP   HISTORY OF PRESENTING ILLNESS:  Bridget Hodges 24 y.o. female who is pregnant at [redacted] weeks who is referred here by her OB because of thrombocytopenia.   Her only PMH was hypothyrodism, and she does not remember if she had CBC before her pregnancy. She moves to [place] last year for score. 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 White count was normal. She has some vaginal bleeding and had was seen at the ED on 05/28/2014, CBC showed hemoglobin 11.6, platelet 94K. her repeated CBC on 06/05/2014 showed Perry count 108 and hemoglobin 11.5.  She has occasional spontaneously mild nose bleeding in the past few month and gum bleeding which is chronic and stable. No other bleeding.  She has easy bruising since childhood.   She feels well overall. She has been gaining weight as expected from pregnancy. She denies any pain, shortness breast, abdominal discomfort, or other symptoms. She feels warm most of time, no fever, no chills or night sweats.   I received her outside CBC from 10/03/2012 showed WBC 6.2, hemoglobin 13.1, hematocrit 39.1, platelet count 126K.   Interim history The returns for follow-up. She is 32 weeks now, her pregnancy is going very well. She denies any bleeding, eating well, gaining weight appropriately. No other complaints.  MEDICAL HISTORY:  History of hypothyroidism. She was treated with Synthroid in the past and is off now.  SURGICAL HISTORY: Past Surgical History  Procedure Laterality Date  . Cyst removal hand Left     SOCIAL HISTORY: History    Social History  . Marital Status: Single    Spouse Name: N/A  . Number of Children: 0  . 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 Smoking status: Never Smoker   . Smokeless tobacco: Never Used  . Alcohol Use: Yes     Comment: social drinker   . Drug Use: Yes    Special: Marijuana  . Sexual Activity: Not on file   Other Topics Concern  . Not on file   Social History Narrative   Fun: Sleep    Denies religious beliefs effecting healthcare.     FAMILY HISTORY: Family History  Problem Relation Age of Onset  . Cancer Maternal Aunt 35    breast cancer   . Healthy Mother   . Healthy Father   . Healthy Maternal Grandmother   . Healthy Maternal Grandfather   . Healthy Paternal Grandmother   . Healthy Paternal Grandfather     ALLERGIES:  is allergic to penicillins.  MEDICATIONS:  Prenatal vitamin once daily  REVIEW OF SYSTEMS:   Constitutional: Denies fevers, chills or abnormal night sweats Eyes: Denies blurriness of vision, double vision or watery eyes Ears, nose, mouth, throat, and face: Denies mucositis or sore throat Respiratory: Denies cough, dyspnea or wheezes Cardiovascular: Denies palpitation, chest discomfort or lower extremity swelling Gastrointestinal:  Denies nausea, heartburn or change in bowel habits Skin: Denies abnormal skin rashes Lymphatics: Denies new lymphadenopathy or easy bruising Neurological:Denies numbness, tingling or new weaknesses Behavioral/Psych: Mood  is stable, no new changes  All other systems were reviewed with the patient and are negative.  PHYSICAL EXAMINATION: ECOG PERFORMANCE STATUS: 0 - Asymptomatic  Filed Vitals:   08/03/14 1402  BP: 119/68  Pulse: 93  Temp: 98.2 F (36.8 C)  Resp: 18   Filed Weights   08/03/14 1402  Weight: 136 lb 14.4 oz (62.097 kg)    GENERAL:alert, no distress and comfortable SKIN: skin color, texture, turgor are normal, no  rashes or significant lesions EYES: normal, conjunctiva are pink and non-injected, sclera clear OROPHARYNX:no exudate, no erythema and lips, buccal mucosa, and tongue normal  NECK: supple, thyroid normal size, non-tender, without nodularity LYMPH:  no palpable lymphadenopathy in the cervical, axillary or inguinal LUNGS: clear to auscultation and percussion with normal breathing effort HEART: regular rate & rhythm and no murmurs and no lower extremity edema ABDOMEN:abdomen soft, non-tender and normal bowel sounds Musculoskeletal:no cyanosis of digits and no clubbing  PSYCH: alert & oriented x 3 with fluent speech NEURO: no focal motor/sensory deficits  LABORATORY DATA:  I have reviewed the data as listed CBC Latest Ref Rng 08/03/2014 07/20/2014 07/06/2014  WBC 3.9 - 10.3 10e3/uL 11.6(H) 12.9(H) 10.3  Hemoglobin 11.6 - 15.9 g/dL 16.1 11.5(L) 11.6  Hematocrit 34.8 - 46.6 % 34.4(L) 34.3(L) 34.0(L)  Platelets 145 - 400 10e3/uL 121(L) 134(L) 130 confirmed by smear, few clumps(L)    CMP Latest Ref Rng 07/06/2014 05/28/2014  Glucose 70 - 140 mg/dl 81 86  BUN 7.0 - 09.6 mg/dL 5.2(L) 6  Creatinine 0.6 - 1.1 mg/dL 0.6 0.45(W)  Sodium 098 - 145 mEq/L 138 135  Potassium 3.5 - 5.1 mEq/L 3.7 3.6  Chloride 96 - 112 mmol/L - 105  CO2 22 - 29 mEq/L 21(L) 23  Calcium 8.4 - 10.4 mg/dL 8.6 8.5  Total Protein 6.4 - 8.3 g/dL 6.6 6.1  Total Bilirubin 0.20 - 1.20 mg/dL <1.19 0.3  Alkaline Phos 40 - 150 U/L 62 38(L)  AST 5 - 34 U/L 13 21  ALT 0 - 55 U/L 12 20      RADIOGRAPHIC STUDIES: I have personally reviewed the radiological images as listed and agreed with the findings in the report. No results found.  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   1. 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 -Her lab tests are reviewed normal folic acid and B12 level, normal LDH, negative hepatitis C and HIV, she has hepatitis B antibody from vaccine. No lab evidence of DIC. -She does have a history of hypothyroidism, and rheumatology factor is slightly positive, her chronic mild some cytopenia is likely ITP, which is an autoimmune disease. -We'll monitor her CBC closely, every 2 weeks for the next 4 weeks, then weekly weekly afterwards until her delivery. -We discussed the treatment options for ITP, such as steroids. She would not need treatment unless plt count drops below 50k or if she has clinical bleeding. -We discussed the possibility of her child having thrombocytopenia at birth, which needs to be monitored -Discussed the risk of bleeding with thrombocytopenia, and avoid injury or fall.  2 history of hypothyroidism -Her TSH level was normal on 07/20/2014  3. Iron deficient anemia -Her ferritin level is 7, serum iron level low at 36, transferrin saturation 7%, consistent with iron deficient anemia. This is likely related  to her pregnancy -I recommend her take over-the-counter iron pill 2-3 tablet daily  Plan: -I will see her back in 4 weeks, repeat CBC every 2 weeksX2, then weekly onto her delivery.   Malachy MoodFeng, [place]  08/03/2014

## 2014-08-10 ENCOUNTER — Other Ambulatory Visit (HOSPITAL_BASED_OUTPATIENT_CLINIC_OR_DEPARTMENT_OTHER): Payer: BLUE CROSS/BLUE SHIELD

## 2014-08-10 DIAGNOSIS — O99119 Other diseases of the blood and blood-forming organs and certain disorders involving the immune mechanism complicating pregnancy, unspecified trimester: Secondary | ICD-10-CM

## 2014-08-10 DIAGNOSIS — Z3A28 28 weeks gestation of pregnancy: Secondary | ICD-10-CM

## 2014-08-10 DIAGNOSIS — D696 Thrombocytopenia, unspecified: Secondary | ICD-10-CM

## 2014-08-10 DIAGNOSIS — E039 Hypothyroidism, unspecified: Secondary | ICD-10-CM

## 2014-08-10 DIAGNOSIS — D649 Anemia, unspecified: Secondary | ICD-10-CM

## 2014-08-10 LAB — CBC & DIFF AND RETIC
BASO%: 0.3 % (ref 0.0–2.0)
Basophils Absolute: 0 10*3/uL (ref 0.0–0.1)
EOS%: 3.2 % (ref 0.0–7.0)
Eosinophils Absolute: 0.3 10*3/uL (ref 0.0–0.5)
HEMATOCRIT: 33.2 % — AB (ref 34.8–46.6)
HEMOGLOBIN: 11 g/dL — AB (ref 11.6–15.9)
IMMATURE RETIC FRACT: 11.3 % — AB (ref 1.60–10.00)
LYMPH%: 21.5 % (ref 14.0–49.7)
MCH: 29 pg (ref 25.1–34.0)
MCHC: 33.1 g/dL (ref 31.5–36.0)
MCV: 87.6 fL (ref 79.5–101.0)
MONO#: 1 10*3/uL — ABNORMAL HIGH (ref 0.1–0.9)
MONO%: 10 % (ref 0.0–14.0)
NEUT#: 6.3 10*3/uL (ref 1.5–6.5)
NEUT%: 65 % (ref 38.4–76.8)
PLATELETS: 137 10*3/uL — AB (ref 145–400)
RBC: 3.79 10*6/uL (ref 3.70–5.45)
RDW: 12.9 % (ref 11.2–14.5)
Retic %: 1.31 % (ref 0.70–2.10)
Retic Ct Abs: 49.65 10*3/uL (ref 33.70–90.70)
WBC: 9.8 10*3/uL (ref 3.9–10.3)
lymph#: 2.1 10*3/uL (ref 0.9–3.3)

## 2014-08-24 ENCOUNTER — Telehealth: Payer: Self-pay | Admitting: Hematology

## 2014-08-24 ENCOUNTER — Other Ambulatory Visit (HOSPITAL_BASED_OUTPATIENT_CLINIC_OR_DEPARTMENT_OTHER): Payer: BLUE CROSS/BLUE SHIELD

## 2014-08-24 DIAGNOSIS — D649 Anemia, unspecified: Secondary | ICD-10-CM

## 2014-08-24 DIAGNOSIS — O99119 Other diseases of the blood and blood-forming organs and certain disorders involving the immune mechanism complicating pregnancy, unspecified trimester: Secondary | ICD-10-CM | POA: Diagnosis not present

## 2014-08-24 DIAGNOSIS — E039 Hypothyroidism, unspecified: Secondary | ICD-10-CM

## 2014-08-24 DIAGNOSIS — D696 Thrombocytopenia, unspecified: Secondary | ICD-10-CM

## 2014-08-24 LAB — CBC & DIFF AND RETIC
BASO%: 0.3 % (ref 0.0–2.0)
Basophils Absolute: 0 10*3/uL (ref 0.0–0.1)
EOS%: 2.7 % (ref 0.0–7.0)
Eosinophils Absolute: 0.3 10*3/uL (ref 0.0–0.5)
HCT: 31.9 % — ABNORMAL LOW (ref 34.8–46.6)
HGB: 10.7 g/dL — ABNORMAL LOW (ref 11.6–15.9)
Immature Retic Fract: 9.3 % (ref 1.60–10.00)
LYMPH%: 15.6 % (ref 14.0–49.7)
MCH: 29 pg (ref 25.1–34.0)
MCHC: 33.5 g/dL (ref 31.5–36.0)
MCV: 86.4 fL (ref 79.5–101.0)
MONO#: 0.7 10*3/uL (ref 0.1–0.9)
MONO%: 7.6 % (ref 0.0–14.0)
NEUT#: 7 10*3/uL — ABNORMAL HIGH (ref 1.5–6.5)
NEUT%: 73.8 % (ref 38.4–76.8)
PLATELETS: 121 10*3/uL — AB (ref 145–400)
RBC: 3.69 10*6/uL — ABNORMAL LOW (ref 3.70–5.45)
RDW: 13.6 % (ref 11.2–14.5)
RETIC %: 1.43 % (ref 0.70–2.10)
Retic Ct Abs: 52.77 10*3/uL (ref 33.70–90.70)
WBC: 9.6 10*3/uL (ref 3.9–10.3)
lymph#: 1.5 10*3/uL (ref 0.9–3.3)

## 2014-08-24 NOTE — Telephone Encounter (Signed)
pt cld to change time for appt-gave pt updated time & date

## 2014-08-31 ENCOUNTER — Other Ambulatory Visit: Payer: BLUE CROSS/BLUE SHIELD

## 2014-08-31 ENCOUNTER — Other Ambulatory Visit (HOSPITAL_BASED_OUTPATIENT_CLINIC_OR_DEPARTMENT_OTHER): Payer: BLUE CROSS/BLUE SHIELD

## 2014-08-31 DIAGNOSIS — D649 Anemia, unspecified: Secondary | ICD-10-CM

## 2014-08-31 DIAGNOSIS — E039 Hypothyroidism, unspecified: Secondary | ICD-10-CM

## 2014-08-31 DIAGNOSIS — D696 Thrombocytopenia, unspecified: Secondary | ICD-10-CM | POA: Diagnosis not present

## 2014-08-31 LAB — CBC & DIFF AND RETIC
BASO%: 0.4 % (ref 0.0–2.0)
Basophils Absolute: 0 10*3/uL (ref 0.0–0.1)
EOS%: 2.7 % (ref 0.0–7.0)
Eosinophils Absolute: 0.3 10*3/uL (ref 0.0–0.5)
HCT: 33.9 % — ABNORMAL LOW (ref 34.8–46.6)
HGB: 11.2 g/dL — ABNORMAL LOW (ref 11.6–15.9)
Immature Retic Fract: 10.8 % — ABNORMAL HIGH (ref 1.60–10.00)
LYMPH#: 1.9 10*3/uL (ref 0.9–3.3)
LYMPH%: 16.7 % (ref 14.0–49.7)
MCH: 28.3 pg (ref 25.1–34.0)
MCHC: 33 g/dL (ref 31.5–36.0)
MCV: 85.6 fL (ref 79.5–101.0)
MONO#: 0.7 10*3/uL (ref 0.1–0.9)
MONO%: 6.3 % (ref 0.0–14.0)
NEUT%: 73.9 % (ref 38.4–76.8)
NEUTROS ABS: 8.3 10*3/uL — AB (ref 1.5–6.5)
Platelets: 139 10*3/uL — ABNORMAL LOW (ref 145–400)
RBC: 3.96 10*6/uL (ref 3.70–5.45)
RDW: 13.8 % (ref 11.2–14.5)
RETIC %: 1.62 % (ref 0.70–2.10)
Retic Ct Abs: 64.15 10*3/uL (ref 33.70–90.70)
WBC: 11.2 10*3/uL — ABNORMAL HIGH (ref 3.9–10.3)

## 2014-09-07 ENCOUNTER — Other Ambulatory Visit: Payer: BLUE CROSS/BLUE SHIELD

## 2014-09-08 ENCOUNTER — Other Ambulatory Visit (HOSPITAL_BASED_OUTPATIENT_CLINIC_OR_DEPARTMENT_OTHER): Payer: BLUE CROSS/BLUE SHIELD

## 2014-09-08 DIAGNOSIS — D696 Thrombocytopenia, unspecified: Secondary | ICD-10-CM

## 2014-09-08 DIAGNOSIS — E039 Hypothyroidism, unspecified: Secondary | ICD-10-CM

## 2014-09-08 DIAGNOSIS — O99119 Other diseases of the blood and blood-forming organs and certain disorders involving the immune mechanism complicating pregnancy, unspecified trimester: Secondary | ICD-10-CM

## 2014-09-08 DIAGNOSIS — D649 Anemia, unspecified: Secondary | ICD-10-CM

## 2014-09-08 LAB — CBC & DIFF AND RETIC
BASO%: 0.3 % (ref 0.0–2.0)
Basophils Absolute: 0 10*3/uL (ref 0.0–0.1)
EOS%: 1.7 % (ref 0.0–7.0)
Eosinophils Absolute: 0.2 10*3/uL (ref 0.0–0.5)
HEMATOCRIT: 30.7 % — AB (ref 34.8–46.6)
HGB: 10 g/dL — ABNORMAL LOW (ref 11.6–15.9)
IMMATURE RETIC FRACT: 15.9 % — AB (ref 1.60–10.00)
LYMPH%: 17.1 % (ref 14.0–49.7)
MCH: 27.2 pg (ref 25.1–34.0)
MCHC: 32.6 g/dL (ref 31.5–36.0)
MCV: 83.7 fL (ref 79.5–101.0)
MONO#: 1 10*3/uL — AB (ref 0.1–0.9)
MONO%: 9.7 % (ref 0.0–14.0)
NEUT%: 71.2 % (ref 38.4–76.8)
NEUTROS ABS: 7.4 10*3/uL — AB (ref 1.5–6.5)
NRBC: 0 % (ref 0–0)
PLATELETS: 146 10*3/uL (ref 145–400)
RBC: 3.67 10*6/uL — ABNORMAL LOW (ref 3.70–5.45)
RDW: 14.1 % (ref 11.2–14.5)
Retic %: 1.69 % (ref 0.70–2.10)
Retic Ct Abs: 62.02 10*3/uL (ref 33.70–90.70)
WBC: 10.4 10*3/uL — AB (ref 3.9–10.3)
lymph#: 1.8 10*3/uL (ref 0.9–3.3)

## 2014-09-11 LAB — OB RESULTS CONSOLE GBS: GBS: NEGATIVE

## 2014-09-14 ENCOUNTER — Other Ambulatory Visit (HOSPITAL_BASED_OUTPATIENT_CLINIC_OR_DEPARTMENT_OTHER): Payer: BLUE CROSS/BLUE SHIELD

## 2014-09-14 DIAGNOSIS — O99119 Other diseases of the blood and blood-forming organs and certain disorders involving the immune mechanism complicating pregnancy, unspecified trimester: Secondary | ICD-10-CM

## 2014-09-14 DIAGNOSIS — D696 Thrombocytopenia, unspecified: Secondary | ICD-10-CM

## 2014-09-14 DIAGNOSIS — E039 Hypothyroidism, unspecified: Secondary | ICD-10-CM

## 2014-09-14 DIAGNOSIS — D649 Anemia, unspecified: Secondary | ICD-10-CM

## 2014-09-14 LAB — CBC & DIFF AND RETIC
BASO%: 0.3 % (ref 0.0–2.0)
Basophils Absolute: 0 10*3/uL (ref 0.0–0.1)
EOS%: 1.8 % (ref 0.0–7.0)
Eosinophils Absolute: 0.2 10*3/uL (ref 0.0–0.5)
HCT: 34.8 % (ref 34.8–46.6)
HEMOGLOBIN: 11.2 g/dL — AB (ref 11.6–15.9)
IMMATURE RETIC FRACT: 19.2 % — AB (ref 1.60–10.00)
LYMPH%: 18.6 % (ref 14.0–49.7)
MCH: 27.1 pg (ref 25.1–34.0)
MCHC: 32.2 g/dL (ref 31.5–36.0)
MCV: 84.1 fL (ref 79.5–101.0)
MONO#: 0.6 10*3/uL (ref 0.1–0.9)
MONO%: 5.9 % (ref 0.0–14.0)
NEUT%: 73.4 % (ref 38.4–76.8)
NEUTROS ABS: 6.9 10*3/uL — AB (ref 1.5–6.5)
Platelets: 147 10*3/uL (ref 145–400)
RBC: 4.14 10*6/uL (ref 3.70–5.45)
RDW: 14.3 % (ref 11.2–14.5)
Retic %: 1.81 % (ref 0.70–2.10)
Retic Ct Abs: 74.93 10*3/uL (ref 33.70–90.70)
WBC: 9.4 10*3/uL (ref 3.9–10.3)
lymph#: 1.7 10*3/uL (ref 0.9–3.3)

## 2014-09-21 ENCOUNTER — Encounter: Payer: BLUE CROSS/BLUE SHIELD | Admitting: Hematology

## 2014-09-21 ENCOUNTER — Other Ambulatory Visit: Payer: BLUE CROSS/BLUE SHIELD

## 2014-09-21 ENCOUNTER — Telehealth: Payer: Self-pay | Admitting: Hematology

## 2014-09-21 NOTE — Progress Notes (Signed)
This encounter was created in error - please disregard.

## 2014-09-21 NOTE — Telephone Encounter (Signed)
Pt came in late for her apt., pt confirmed r/s D/T sent msg to MD... Bridget Hodges gave pt Calendar

## 2014-09-25 ENCOUNTER — Ambulatory Visit (HOSPITAL_BASED_OUTPATIENT_CLINIC_OR_DEPARTMENT_OTHER): Payer: BLUE CROSS/BLUE SHIELD | Admitting: Hematology

## 2014-09-25 ENCOUNTER — Telehealth: Payer: Self-pay | Admitting: *Deleted

## 2014-09-25 ENCOUNTER — Encounter: Payer: Self-pay | Admitting: Hematology

## 2014-09-25 ENCOUNTER — Telehealth: Payer: Self-pay | Admitting: Hematology

## 2014-09-25 ENCOUNTER — Other Ambulatory Visit (HOSPITAL_BASED_OUTPATIENT_CLINIC_OR_DEPARTMENT_OTHER): Payer: BLUE CROSS/BLUE SHIELD

## 2014-09-25 VITALS — BP 103/74 | HR 83 | Temp 98.4°F | Resp 17 | Ht <= 58 in | Wt 149.6 lb

## 2014-09-25 DIAGNOSIS — D696 Thrombocytopenia, unspecified: Secondary | ICD-10-CM | POA: Diagnosis not present

## 2014-09-25 DIAGNOSIS — O99119 Other diseases of the blood and blood-forming organs and certain disorders involving the immune mechanism complicating pregnancy, unspecified trimester: Secondary | ICD-10-CM

## 2014-09-25 DIAGNOSIS — E039 Hypothyroidism, unspecified: Secondary | ICD-10-CM | POA: Diagnosis not present

## 2014-09-25 DIAGNOSIS — D509 Iron deficiency anemia, unspecified: Secondary | ICD-10-CM

## 2014-09-25 DIAGNOSIS — D649 Anemia, unspecified: Secondary | ICD-10-CM

## 2014-09-25 LAB — CBC & DIFF AND RETIC
BASO%: 0.5 % (ref 0.0–2.0)
BASOS ABS: 0.1 10*3/uL (ref 0.0–0.1)
EOS%: 1.9 % (ref 0.0–7.0)
Eosinophils Absolute: 0.2 10*3/uL (ref 0.0–0.5)
HCT: 33.3 % — ABNORMAL LOW (ref 34.8–46.6)
HEMOGLOBIN: 10.8 g/dL — AB (ref 11.6–15.9)
Immature Retic Fract: 17 % — ABNORMAL HIGH (ref 1.60–10.00)
LYMPH%: 17.1 % (ref 14.0–49.7)
MCH: 27 pg (ref 25.1–34.0)
MCHC: 32.4 g/dL (ref 31.5–36.0)
MCV: 83.3 fL (ref 79.5–101.0)
MONO#: 0.4 10*3/uL (ref 0.1–0.9)
MONO%: 3.7 % (ref 0.0–14.0)
NEUT#: 8.5 10*3/uL — ABNORMAL HIGH (ref 1.5–6.5)
NEUT%: 76.8 % (ref 38.4–76.8)
Platelets: 132 10*3/uL — ABNORMAL LOW (ref 145–400)
RBC: 4 10*6/uL (ref 3.70–5.45)
RDW: 14.9 % — ABNORMAL HIGH (ref 11.2–14.5)
Retic %: 1.9 % (ref 0.70–2.10)
Retic Ct Abs: 76 10*3/uL (ref 33.70–90.70)
WBC: 11.1 10*3/uL — AB (ref 3.9–10.3)
lymph#: 1.9 10*3/uL (ref 0.9–3.3)

## 2014-09-25 NOTE — Telephone Encounter (Signed)
Pt confirmed labs/ov per 07/25 POF, gave pt AVS and Calendar.... KJ °

## 2014-09-25 NOTE — Progress Notes (Signed)
[facility]  Telephone:(336) [phone] Fax:(336) [phone]  Clinic New Consult Note   Patient Care Team: Veryl Speak, FNP as PCP - General (Family Medicine) Lavina Hamman, MD as Consulting Physician (Obstetrics and Gynecology) 09/25/2014  CHIEF COMPLAINTS/PURPOSE OF CONSULTATION:  Chronic ITP   HISTORY OF PRESENTING ILLNESS:  Bridget Hodges 24 y.o. female who is pregnant at [redacted] weeks who is referred here by her OB because of thrombocytopenia.   Her only PMH was hypothyrodism, and she does not remember if she had CBC before her pregnancy. She moves to [place] last year for score. 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 White count was normal. She has some vaginal bleeding and had was seen at the ED on 05/28/2014, CBC showed hemoglobin 11.6, platelet 94K. her repeated CBC on 06/05/2014 showed Perry count 108 and hemoglobin 11.5.  She has occasional spontaneously mild nose bleeding in the past few month and gum bleeding which is chronic and stable. No other bleeding.  She has easy bruising since childhood.   She feels well overall. She has been gaining weight as expected from pregnancy. She denies any pain, shortness breast, abdominal discomfort, or other symptoms. She feels warm most of time, no fever, no chills or night sweats.   I received her outside CBC from 10/03/2012 showed WBC 6.2, hemoglobin 13.1, hematocrit 39.1, platelet count 126K.   Interim history The returns for follow-up. She is 39 weeks now.  Her pregnancy has been going well.  She noticed some mild finger joints swelling and pain,  With some numbness on the tip of fingers.  No other joints  Pain or swelling.  She denied any bleeding episodes including hematochezia, melana, hemoptysis, hematuria or epitaxis. No mucosal bleeding or easy bruising.    MEDICAL HISTORY:  History of hypothyroidism. She  was treated with Synthroid in the past and is off now.  SURGICAL HISTORY: Past Surgical History  Procedure Laterality Date  . Cyst removal hand Left     SOCIAL HISTORY: History   Social History  . Marital Status: Single    Spouse Name: N/A  . Number of Children: 0  . 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 Smoking status: Never Smoker   . Smokeless tobacco: Never Used  . Alcohol Use: Yes     Comment: social drinker   . Drug Use: Yes    Special: Marijuana  . Sexual Activity: Not on file   Other Topics Concern  . Not on file   Social History Narrative   Fun: Sleep    Denies religious beliefs effecting healthcare.     FAMILY HISTORY: Family History  Problem Relation Age of Onset  . Cancer Maternal Aunt 35    breast cancer   . Healthy Mother   . Healthy Father   . Healthy Maternal Grandmother   . Healthy Maternal Grandfather   . Healthy Paternal Grandmother   . Healthy Paternal Grandfather     ALLERGIES:  is allergic to penicillins.  MEDICATIONS:  Prenatal vitamin once daily  REVIEW OF SYSTEMS:   Constitutional: Denies fevers, chills or abnormal night sweats Eyes: Denies blurriness of vision, double vision or watery eyes Ears, nose, mouth, throat, and face: Denies mucositis or sore throat Respiratory: Denies cough, dyspnea or wheezes Cardiovascular: Denies  palpitation, chest discomfort or lower extremity swelling Gastrointestinal:  Denies nausea, heartburn or change in bowel habits Skin: Denies abnormal skin rashes Lymphatics: Denies new lymphadenopathy or easy bruising Neurological:Denies numbness, tingling or new weaknesses Behavioral/Psych: Mood is stable, no new changes  All other systems were reviewed with the patient and are negative.  PHYSICAL EXAMINATION: ECOG PERFORMANCE STATUS: 0 - Asymptomatic  Filed Vitals:   09/25/14 1355  BP: 103/74  Pulse: 83  Temp: 98.4 F (36.9 C)    Resp: 17   Filed Weights   09/25/14 1355  Weight: 149 lb 9.6 oz (67.858 kg)    GENERAL:alert, no distress and comfortable SKIN: skin color, texture, turgor are normal, no rashes or significant lesions EYES: normal, conjunctiva are pink and non-injected, sclera clear OROPHARYNX:no exudate, no erythema and lips, buccal mucosa, and tongue normal  NECK: supple, thyroid normal size, non-tender, without nodularity LYMPH:  no palpable lymphadenopathy in the cervical, axillary or inguinal LUNGS: clear to auscultation and percussion with normal breathing effort HEART: regular rate & rhythm and no murmurs and no lower extremity edema ABDOMEN:abdomen soft, non-tender and normal bowel sounds Musculoskeletal:no cyanosis of digits and no clubbing  PSYCH: alert & oriented x 3 with fluent speech NEURO: no focal motor/sensory deficits  LABORATORY DATA:  I have reviewed the data as listed CBC Latest Ref Rng 09/25/2014 09/14/2014 09/08/2014  WBC 3.9 - 10.3 10e3/uL 11.1(H) 9.4 10.4(H)  Hemoglobin 11.6 - 15.9 g/dL 10.8(L) 11.2(L) 10.0(L)  Hematocrit 34.8 - 46.6 % 33.3(L) 34.8 30.7(L)  Platelets 145 - 400 10e3/uL 132(L) 147 146    CMP Latest Ref Rng 07/06/2014 05/28/2014  Glucose 70 - 140 mg/dl 81 86  BUN 7.0 - 81.1 mg/dL 5.2(L) 6  Creatinine 0.6 - 1.1 mg/dL 0.6 9.14(N)  Sodium 829 - 145 mEq/L 138 135  Potassium 3.5 - 5.1 mEq/L 3.7 3.6  Chloride 96 - 112 mmol/L - 105  CO2 22 - 29 mEq/L 21(L) 23  Calcium 8.4 - 10.4 mg/dL 8.6 8.5  Total Protein 6.4 - 8.3 g/dL 6.6 6.1  Total Bilirubin 0.20 - 1.20 mg/dL <5.62 0.3  Alkaline Phos 40 - 150 U/L 62 38(L)  AST 5 - 34 U/L 13 21  ALT 0 - 55 U/L 12 20     RADIOGRAPHIC STUDIES: I have personally reviewed the radiological images as listed and agreed with the findings in the report. No results found.  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   1. 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 -Her lab tests are reviewed normal folic acid and B12 level, normal LDH, negative hepatitis C and HIV, she has hepatitis B antibody from vaccine. No lab evidence of DIC. -She does have a history of hypothyroidism, and rheumatology factor is slightly positive, her chronic mild thrombocytopenia is likely ITP, which is an autoimmune disease. - her daily count was normal in the past few 2 weeks, 137K today.  She should not have any significant risk of bleeding with epidural for delivery or  Delivery to self -We'll  Continue monitoring her CBC  Weekly  continue her delivery -We discussed the treatment options for ITP, such as steroids. She would not need treatment unless plt count drops below 50k or if she has clinical bleeding. -We discussed the possibility of her child having thrombocytopenia at birth, which needs to be  monitored -Discussed the risk of bleeding with thrombocytopenia, and avoid injury or fall.  2 history of hypothyroidism -Her TSH level was normal on 07/20/2014  3. Iron deficient anemia -Her ferritin level is 7, serum iron level low at 36, transferrin saturation 7%, consistent with iron deficient anemia. This is likely related to her pregnancy -I recommend her take over-the-counter iron pill 2-3 tablet daily  Plan: -I will see her back in 2 months, with lab CBC.   - I'll fax over my note and lab results to her OB Dr. Jackelyn Knife today    Malachy Mood  09/25/2014

## 2014-09-25 NOTE — Telephone Encounter (Signed)
Faxed todays lab & OV note to Dr Tawanna Cooler Meisinger @ [phone] per Dr Latanya Maudlin instructions.

## 2014-09-27 ENCOUNTER — Inpatient Hospital Stay (HOSPITAL_COMMUNITY)
Admission: AD | Admit: 2014-09-27 | Discharge: 2014-09-30 | DRG: 775 | Disposition: A | Discharge status: Home / Self Care | Payer: BLUE CROSS/BLUE SHIELD | Source: Ambulatory Visit | Attending: Obstetrics and Gynecology | Admitting: Obstetrics and Gynecology

## 2014-09-27 ENCOUNTER — Inpatient Hospital Stay (HOSPITAL_COMMUNITY): Payer: BLUE CROSS/BLUE SHIELD | Admitting: Anesthesiology

## 2014-09-27 ENCOUNTER — Encounter (HOSPITAL_COMMUNITY): Payer: Self-pay | Admitting: *Deleted

## 2014-09-27 DIAGNOSIS — Z3490 Encounter for supervision of normal pregnancy, unspecified, unspecified trimester: Secondary | ICD-10-CM

## 2014-09-27 DIAGNOSIS — K219 Gastro-esophageal reflux disease without esophagitis: Secondary | ICD-10-CM | POA: Diagnosis present

## 2014-09-27 DIAGNOSIS — IMO0001 Reserved for inherently not codable concepts without codable children: Secondary | ICD-10-CM

## 2014-09-27 DIAGNOSIS — E039 Hypothyroidism, unspecified: Secondary | ICD-10-CM | POA: Diagnosis present

## 2014-09-27 DIAGNOSIS — Z3A39 39 weeks gestation of pregnancy: Secondary | ICD-10-CM | POA: Diagnosis present

## 2014-09-27 DIAGNOSIS — D693 Immune thrombocytopenic purpura: Secondary | ICD-10-CM | POA: Diagnosis present

## 2014-09-27 DIAGNOSIS — O9962 Diseases of the digestive system complicating childbirth: Secondary | ICD-10-CM | POA: Diagnosis present

## 2014-09-27 DIAGNOSIS — O9912 Other diseases of the blood and blood-forming organs and certain disorders involving the immune mechanism complicating childbirth: Secondary | ICD-10-CM | POA: Diagnosis present

## 2014-09-27 DIAGNOSIS — O99284 Endocrine, nutritional and metabolic diseases complicating childbirth: Secondary | ICD-10-CM | POA: Diagnosis present

## 2014-09-27 LAB — CBC
HEMATOCRIT: 31.8 % — AB (ref 36.0–46.0)
HEMOGLOBIN: 10.4 g/dL — AB (ref 12.0–15.0)
MCH: 26.9 pg (ref 26.0–34.0)
MCHC: 32.7 g/dL (ref 30.0–36.0)
MCV: 82.4 fL (ref 78.0–100.0)
PLATELETS: 154 10*3/uL (ref 150–400)
RBC: 3.86 MIL/uL — ABNORMAL LOW (ref 3.87–5.11)
RDW: 15.2 % (ref 11.5–15.5)
WBC: 13.7 10*3/uL — ABNORMAL HIGH (ref 4.0–10.5)

## 2014-09-27 MED ORDER — OXYCODONE-ACETAMINOPHEN 5-325 MG PO TABS: 1.0000 | ORAL_TABLET | ORAL | Status: DC | PRN

## 2014-09-27 MED ORDER — DIPHENHYDRAMINE HCL 50 MG/ML IJ SOLN: 12.5000 mg | INTRAMUSCULAR | Status: DC | PRN

## 2014-09-27 MED ORDER — CITRIC ACID-SODIUM CITRATE 334-500 MG/5ML PO SOLN: 30.0000 mL | ORAL | Status: DC | PRN

## 2014-09-27 MED ORDER — EPHEDRINE 5 MG/ML INJ
10.0000 mg | INTRAVENOUS | Status: DC | PRN
  Filled 2014-09-27: qty 2

## 2014-09-27 MED ORDER — PHENYLEPHRINE 40 MCG/ML (10ML) SYRINGE FOR IV PUSH (FOR BLOOD PRESSURE SUPPORT): 80.0000 ug | PREFILLED_SYRINGE | INTRAVENOUS | Status: DC | PRN

## 2014-09-27 MED ORDER — BUTORPHANOL TARTRATE 1 MG/ML IJ SOLN
1.0000 mg | INTRAMUSCULAR | Status: DC | PRN
  Administered 2014-09-27 (×5): 1 mg via INTRAVENOUS
  Filled 2014-09-27 (×4): qty 1

## 2014-09-27 MED ORDER — ONDANSETRON HCL 4 MG/2ML IJ SOLN: 4.0000 mg | Freq: Four times a day (QID) | INTRAMUSCULAR | Status: DC | PRN

## 2014-09-27 MED ORDER — FENTANYL 2.5 MCG/ML BUPIVACAINE 1/10 % EPIDURAL INFUSION (WH - ANES)
11.0000 mL/h | INTRAMUSCULAR | Status: DC | PRN
  Administered 2014-09-27 – 2014-09-28 (×2): 11 mL/h via EPIDURAL
  Filled 2014-09-27: qty 125

## 2014-09-27 MED ORDER — BUTORPHANOL TARTRATE 1 MG/ML IJ SOLN
INTRAMUSCULAR | Status: AC
  Administered 2014-09-27: 1 mg via INTRAVENOUS
  Filled 2014-09-27: qty 1

## 2014-09-27 MED ORDER — LIDOCAINE HCL (PF) 1 % IJ SOLN
30.0000 mL | INTRAMUSCULAR | Status: DC | PRN
  Filled 2014-09-27: qty 30

## 2014-09-27 MED ORDER — PHENYLEPHRINE 40 MCG/ML (10ML) SYRINGE FOR IV PUSH (FOR BLOOD PRESSURE SUPPORT)
80.0000 ug | PREFILLED_SYRINGE | INTRAVENOUS | Status: DC | PRN
  Filled 2014-09-27: qty 2
  Filled 2014-09-27: qty 20

## 2014-09-27 MED ORDER — LIDOCAINE HCL (PF) 1 % IJ SOLN
INTRAMUSCULAR | Status: DC | PRN
  Administered 2014-09-27: 4 mL via EPIDURAL
  Administered 2014-09-27: 3 mL via EPIDURAL

## 2014-09-27 MED ORDER — OXYTOCIN 40 UNITS IN LACTATED RINGERS INFUSION - SIMPLE MED
62.5000 mL/h | INTRAVENOUS | Status: DC
  Administered 2014-09-27: 1 mL/h via INTRAVENOUS
  Administered 2014-09-28: 999 mL/h via INTRAVENOUS
  Filled 2014-09-27: qty 1000

## 2014-09-27 MED ORDER — FENTANYL 2.5 MCG/ML BUPIVACAINE 1/10 % EPIDURAL INFUSION (WH - ANES)
14.0000 mL/h | INTRAMUSCULAR | Status: DC | PRN
  Filled 2014-09-27: qty 125

## 2014-09-27 MED ORDER — TERBUTALINE SULFATE 1 MG/ML IJ SOLN: 0.2500 mg | Freq: Once | INTRAMUSCULAR | Status: AC | PRN

## 2014-09-27 MED ORDER — OXYCODONE-ACETAMINOPHEN 5-325 MG PO TABS: 2.0000 | ORAL_TABLET | ORAL | Status: DC | PRN

## 2014-09-27 MED ORDER — OXYTOCIN BOLUS FROM INFUSION: 500.0000 mL | INTRAVENOUS | Status: DC

## 2014-09-27 MED ORDER — LACTATED RINGERS IV SOLN
INTRAVENOUS | Status: DC
  Administered 2014-09-27 – 2014-09-28 (×3): via INTRAVENOUS

## 2014-09-27 MED ORDER — LACTATED RINGERS IV SOLN: 500.0000 mL | INTRAVENOUS | Status: DC | PRN

## 2014-09-27 MED ORDER — OXYTOCIN 40 UNITS IN LACTATED RINGERS INFUSION - SIMPLE MED
1.0000 m[IU]/min | INTRAVENOUS | Status: DC
Start: 2014-09-27 — End: 2014-09-28
  Administered 2014-09-27: 1 m[IU]/min via INTRAVENOUS
  Administered 2014-09-27: 2 m[IU]/min via INTRAVENOUS

## 2014-09-27 NOTE — Anesthesia Procedure Notes (Signed)
Epidural Patient location during procedure: OB Start time: 09/27/2014 6:35 PM  Staffing Anesthesiologist: Mal Amabile Performed by: anesthesiologist   Preanesthetic Checklist Completed: patient identified, site marked, surgical consent, pre-op evaluation, timeout performed, IV checked, risks and benefits discussed and monitors and equipment checked  Epidural Patient position: sitting Prep: site prepped and draped and DuraPrep Patient monitoring: continuous pulse ox and blood pressure Approach: midline Location: L3-L4 Injection technique: LOR air  Needle:  Needle type: Tuohy  Needle gauge: 17 G Needle length: 9 cm and 9 Needle insertion depth: 4 cm Catheter type: closed end flexible Catheter size: 19 Gauge Catheter at skin depth: 9 cm Test dose: negative and Other  Assessment Events: blood not aspirated, injection not painful, no injection resistance, negative IV test and no paresthesia  Additional Notes Patient identified. Risks and benefits discussed including failed block, incomplete  Pain control, post dural puncture headache, nerve damage, paralysis, blood pressure Changes, nausea, vomiting, reactions to medications-both toxic and allergic and post Partum back pain. All questions were answered. Patient expressed understanding and wished to proceed. Sterile technique was used throughout procedure. Epidural site was Dressed with sterile barrier dressing. No paresthesias, signs of intravascular injection Or signs of intrathecal spread were encountered.  Patient was more comfortable after the epidural was dosed. Please see RN's note for documentation of vital signs and FHR which are stable.

## 2014-09-27 NOTE — Progress Notes (Signed)
Dr Jackelyn Knife notified of pt's VE, contraction pattern, orders received to admit pt

## 2014-09-27 NOTE — MAU Note (Signed)
PT SAYS HURT  BAD   AT 0430.    VE IN OFFICE  ON LAST  Monday  1  CM.   DENIES HSV AND  MRSA.  GBS-  NEG

## 2014-09-27 NOTE — H&P (Signed)
Bridget Hodges is a 24 y.o. female, G2 P0010, EGA 39+ weeks with EDC 7-31 presenting for eval of ctx.  On initial eval, VE 1 cm with painful ctx.  She walked, and on re-eval VE 3 cm.  She is waiting for a bed in L&D.  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 Mosetta Putt, stable without medication.  Maternal Medical History:  Reason for admission: Contractions.   Contractions: Frequency: irregular.   Perceived severity is moderate.    Fetal activity: Perceived fetal activity is normal.      OB History    Gravida Para Term Preterm AB TAB SAB Ectopic Multiple Living       Past Medical History  Diagnosis Date  . Asthma   . Thrombocytopenia    Past Surgical History  Procedure Laterality Date  . Cyst removal hand Left    Family History: family history includes Cancer (age of onset: 95) in her maternal aunt; Healthy in her father, maternal grandfather, maternal grandmother, mother, paternal grandfather, and paternal grandmother. Social History:  reports that she has never smoked. She has never used smokeless tobacco. She reports that she drinks alcohol. She reports that she uses illicit drugs (Marijuana).   Prenatal Transfer Tool  Maternal Diabetes: No Genetic Screening: Normal Maternal Ultrasounds/Referrals: Normal Fetal Ultrasounds or other Referrals:  None Maternal Substance Abuse:  No Significant Maternal Medications:  None Significant Maternal Lab Results:  Lab values include: Group B Strep negative Other Comments:  Mild ITP  Review of Systems  Respiratory: Negative.   Cardiovascular: Negative.     Dilation: 3 Effacement (%): 90 Station: -3 Exam by:: Ginger Morris RN Blood pressure 120/66, pulse 72, temperature 98.3 F (36.8 C), temperature source Oral, resp. rate 20, height  (1.422 m), weight 67.756 kg (149 lb 6 oz). Maternal Exam:  Uterine Assessment: Contraction strength is moderate.  Contraction frequency is irregular.    Abdomen: Patient reports no abdominal tenderness. Estimated fetal weight is 7 lbs.   Fetal presentation: vertex  Introitus: Normal vulva. Normal vagina.  Amniotic fluid character: not assessed.  Pelvis: adequate for delivery.   Cervix: Cervix evaluated by digital exam.     Fetal Exam Fetal Monitor Review: Mode: ultrasound.   Variability: moderate (6-25 bpm).   Pattern: accelerations present and no decelerations.    Fetal State Assessment: Category I - tracings are normal.     Physical Exam  Vitals reviewed. Constitutional: She appears well-developed and well-nourished.  Cardiovascular: Normal rate, regular rhythm and normal heart sounds.   No murmur heard. Respiratory: Effort normal and breath sounds normal. No respiratory distress. She has no wheezes.  GI: Soft.    Prenatal labs: ABO, Rh: --/--/A POS (03/27 0245) Antibody:  neg Rubella:  Immune RPR:   NR HBsAg:   Neg HIV: NONREACTIVE (05/05 1134)  GBS:   Neg  Assessment/Plan: IUP at 39+ weeks in early labor.  Will admit, monitor progress, anticipate SVD.   Kashmere Daywalt D 09/27/2014, 1:19 PM

## 2014-09-27 NOTE — MAU Note (Signed)
Pt taken off to ambulate

## 2014-09-27 NOTE — Anesthesia Preprocedure Evaluation (Addendum)
Anesthesia Evaluation  Patient identified by MRN, date of birth, ID band Patient awake    Reviewed: Allergy & Precautions, H&P , Patient's Chart, lab work & pertinent test results  Airway Mallampati: III  TM Distance: >3 FB Neck ROM: full    Dental no notable dental hx. (+) Teeth Intact   Pulmonary neg pulmonary ROS, asthma ,  breath sounds clear to auscultation  Pulmonary exam normal       Cardiovascular negative cardio ROS Normal cardiovascular examRhythm:regular Rate:Normal     Neuro/Psych negative neurological ROS  negative psych ROS   GI/Hepatic Neg liver ROS, GERD-  Medicated and Controlled,  Endo/Other  Hypothyroidism Obesity  Renal/GU negative Renal ROS  negative genitourinary   Musculoskeletal   Abdominal   Peds  Hematology  (+) anemia , Thrombocytopenia   Anesthesia Other Findings   Reproductive/Obstetrics (+) Pregnancy                            Anesthesia Physical Anesthesia Plan  ASA: II  Anesthesia Plan: Epidural   Post-op Pain Management:    Induction:   Airway Management Planned:   Additional Equipment:   Intra-op Plan:   Post-operative Plan:   Informed Consent: I have reviewed the patients History and Physical, chart, labs and discussed the procedure including the risks, benefits and alternatives for the proposed anesthesia with the patient or authorized representative who has indicated his/her understanding and acceptance.     Plan Discussed with: Anesthesiologist  Anesthesia Plan Comments:         Anesthesia Quick Evaluation

## 2014-09-28 ENCOUNTER — Encounter (HOSPITAL_COMMUNITY): Payer: Self-pay | Admitting: Obstetrics and Gynecology

## 2014-09-28 LAB — RPR: RPR Ser Ql: NONREACTIVE

## 2014-09-28 LAB — ABO/RH: ABO/RH(D): A POS

## 2014-09-28 LAB — TYPE AND SCREEN
ABO/RH(D): A POS
Antibody Screen: NEGATIVE

## 2014-09-28 MED ORDER — ONDANSETRON HCL 4 MG PO TABS: 4.0000 mg | ORAL_TABLET | ORAL | Status: DC | PRN

## 2014-09-28 MED ORDER — LACTATED RINGERS IV SOLN: INTRAVENOUS | Status: DC

## 2014-09-28 MED ORDER — TETANUS-DIPHTH-ACELL PERTUSSIS 5-2.5-18.5 LF-MCG/0.5 IM SUSP
0.5000 mL | Freq: Once | INTRAMUSCULAR | Status: DC
  Filled 2014-09-28: qty 0.5

## 2014-09-28 MED ORDER — WITCH HAZEL-GLYCERIN EX PADS: 1.0000 "application " | MEDICATED_PAD | CUTANEOUS | Status: DC | PRN

## 2014-09-28 MED ORDER — SENNOSIDES-DOCUSATE SODIUM 8.6-50 MG PO TABS
2.0000 | ORAL_TABLET | ORAL | Status: DC
  Administered 2014-09-28 – 2014-09-29 (×2): 2 via ORAL
  Filled 2014-09-28 (×2): qty 2

## 2014-09-28 MED ORDER — OXYCODONE-ACETAMINOPHEN 5-325 MG PO TABS: 2.0000 | ORAL_TABLET | ORAL | Status: DC | PRN

## 2014-09-28 MED ORDER — ALBUTEROL SULFATE (2.5 MG/3ML) 0.083% IN NEBU
INHALATION_SOLUTION | Freq: Four times a day (QID) | RESPIRATORY_TRACT | Status: DC | PRN
Start: 2014-09-28 — End: 2014-09-30

## 2014-09-28 MED ORDER — IBUPROFEN 600 MG PO TABS
600.0000 mg | ORAL_TABLET | Freq: Four times a day (QID) | ORAL | Status: DC
  Administered 2014-09-28 – 2014-09-30 (×8): 600 mg via ORAL
  Filled 2014-09-28 (×8): qty 1

## 2014-09-28 MED ORDER — BENZOCAINE-MENTHOL 20-0.5 % EX AERO
1.0000 "application " | INHALATION_SPRAY | CUTANEOUS | Status: DC | PRN
  Administered 2014-09-28: 1 via TOPICAL
  Filled 2014-09-28: qty 56

## 2014-09-28 MED ORDER — PRENATAL MULTIVITAMIN CH
1.0000 | ORAL_TABLET | Freq: Every day | ORAL | Status: DC
  Administered 2014-09-28 – 2014-09-30 (×3): 1 via ORAL
  Filled 2014-09-28 (×3): qty 1

## 2014-09-28 MED ORDER — CALCIUM CARBONATE ANTACID 500 MG PO CHEW: 1.0000 | CHEWABLE_TABLET | Freq: Every day | ORAL | Status: DC | PRN

## 2014-09-28 MED ORDER — LANOLIN HYDROUS EX OINT: TOPICAL_OINTMENT | CUTANEOUS | Status: DC | PRN

## 2014-09-28 MED ORDER — DIPHENHYDRAMINE HCL 25 MG PO CAPS: 25.0000 mg | ORAL_CAPSULE | Freq: Four times a day (QID) | ORAL | Status: DC | PRN

## 2014-09-28 MED ORDER — ONDANSETRON HCL 4 MG/2ML IJ SOLN: 4.0000 mg | INTRAMUSCULAR | Status: DC | PRN

## 2014-09-28 MED ORDER — OXYCODONE-ACETAMINOPHEN 5-325 MG PO TABS: 1.0000 | ORAL_TABLET | ORAL | Status: DC | PRN

## 2014-09-28 MED ORDER — ACETAMINOPHEN 325 MG PO TABS: 650.0000 mg | ORAL_TABLET | ORAL | Status: DC | PRN

## 2014-09-28 MED ORDER — ZOLPIDEM TARTRATE 5 MG PO TABS: 5.0000 mg | ORAL_TABLET | Freq: Every evening | ORAL | Status: DC | PRN

## 2014-09-28 MED ORDER — DIBUCAINE 1 % RE OINT
1.0000 "application " | TOPICAL_OINTMENT | RECTAL | Status: DC | PRN
  Filled 2014-09-28: qty 28

## 2014-09-28 MED ORDER — SIMETHICONE 80 MG PO CHEW: 80.0000 mg | CHEWABLE_TABLET | ORAL | Status: DC | PRN

## 2014-09-28 NOTE — Progress Notes (Signed)
Pt stayed inMAU most of the day yesterday, had SROM at about 1630 and VE was 3-4 cm.  She got to L&D not long after that and received an epidural.  She progressed to 6 cm and then has had protracted labor.  Was started on low dose pitocin early this morning.  She is comfortable with her epidural Tmax 100.5, repeat 100.1, VSS FHT- Cat II, occ variable, mod variability, ctx q 2-4 min VE reducible rim/C/+1, vtx, able to push rim away Will start pushing and see how she does, monitor temp and for other signs/sx of chorio

## 2014-09-28 NOTE — Lactation Note (Signed)
This note was copied from the chart of Boy Oliver Neuwirth. Lactation Consultation Note  Patient Name: Boy [place] Flett [id number] Date: 09/28/2014 Reason for consult: Initial assessment   Initial consult at 5 hours old; GA 40.2; BW 8 lbs, 1.6 oz.  Mom is a P1 with previous Hx MJ Use.  Vag delivery. Infant has breastfed x1 (10 min) since birth; voids-0; stools-2. Mom has semi-flat nipples d/t edema, but compressible areolas; mom states nipples are normally erect. LC assisted mom with latching infant for second BF.  Taught mom how to sandwich breast and latch using asymmetrical latching technique in cross-cradle hold on left breast.   Taught dad how to assist with teacup hold and flanging bottom lip.  Infant took a few sucks and came off; LC & mom kept re-latching infant and he would take a few more sucks each time. Infant fed for 5 min on/off.  LS-6. Mom may need #20 nipple shield if infant continues with on/off pattern and has difficulty latching.   LC did not start NS at this time d/t only 5 hrs old and infant was showing some signs of latching with a "tug" for a few minutes before spitting nipple out.    Mom is "very excited" about breastfeeding.  FOB at bedside receptive to instructions and assisting with latching.   Mom demonstrated hand expression independently with observation of colostrum easily expressed from breast. Hand pump and shells given to help erect nipples prior to feeding; taught mom how to use.  Spoons, curved-tip syringe, and colostrum collection container given for EBM feedings as needed. Educated on feeding with feeding cues, cluster feeding, and size of infant's stomach. Lactation brochure given and informed of hospital support group and outpatient services.  Encouraged mom to call for assistance as needed with latching.     Maternal Data Formula Feeding for Exclusion: No Has patient been taught Hand Expression?: Yes Does the patient have breastfeeding experience prior  to this delivery?: No  Feeding Feeding Type: Breast Fed Length of feed: 5 min  LATCH Score/Interventions Latch: Repeated attempts needed to sustain latch, nipple held in mouth throughout feeding, stimulation needed to elicit sucking reflex. Intervention(s): Breast compression;Assist with latch;Adjust position  Audible Swallowing: A few with stimulation  Type of Nipple: Flat (semi-flat d/t edema; mom states normally erect; 5hrs PP) Intervention(s): Hand pump;Shells  Comfort (Breast/Nipple): Soft / non-tender     Hold (Positioning): Assistance needed to correctly position infant at breast and maintain latch. Intervention(s): Breastfeeding basics reviewed;Support Pillows;Skin to skin  LATCH Score: 6  Lactation Tools Discussed/Used WIC Program: Yes Pump Review: Setup, frequency, and cleaning;Milk Storage Initiated by:: Burna Sis, RN, IBCLC Date initiated:: 09/28/14   Consult Status Consult Status: Follow-up Date: 09/29/14 Follow-up type: In-patient    Lendon Ka 09/28/2014, 2:39 PM

## 2014-09-29 LAB — CBC
HCT: 24.5 % — ABNORMAL LOW (ref 36.0–46.0)
Hemoglobin: 8.1 g/dL — ABNORMAL LOW (ref 12.0–15.0)
MCH: 27 pg (ref 26.0–34.0)
MCHC: 33.1 g/dL (ref 30.0–36.0)
MCV: 81.7 fL (ref 78.0–100.0)
PLATELETS: 121 10*3/uL — AB (ref 150–400)
RBC: 3 MIL/uL — AB (ref 3.87–5.11)
RDW: 15.6 % — AB (ref 11.5–15.5)
WBC: 22.3 10*3/uL — ABNORMAL HIGH (ref 4.0–10.5)

## 2014-09-29 NOTE — Anesthesia Postprocedure Evaluation (Signed)
Anesthesia Post Note  Patient: Bridget Hodges  Procedure(s) Performed: * No procedures listed *  Anesthesia type: Epidural  Patient location: Mother/Baby  Post pain: Pain level controlled  Post assessment: Post-op Vital signs reviewed  Last Vitals:  Filed Vitals:   09/28/14 2345  BP: 104/52  Pulse: 86  Temp:   Resp: 16    Post vital signs: Reviewed  Level of consciousness: awake  Complications: No apparent anesthesia complications

## 2014-09-29 NOTE — Progress Notes (Signed)
Patient ID: Bridget Hodges, female   DOB: 06-16-1990, 24 y.o.   MRN: [id number] #[id number] afebrile BP normal no complaints

## 2014-09-29 NOTE — Lactation Note (Signed)
This note was copied from the chart of Bridget Hodges. Lactation Consultation Note  Patient Name: Bridget Leiloni Smithers [id number] Date: 09/29/2014 Reason for consult: Follow-up assessment;Breast/nipple pain;Difficult latch Mom reports baby is not staying awake at the breast today. Mom's nipples are flat with aerola edema. Mom has just pumped 7 ml of colostrum from left breast. Attempted to get baby to latch to the right breast but baby slipping to end of nipple and not sustaining depth. After several tries attempted to latch with 20/24 nipple shield. Baby however could not organize his suck with the nipple shield. Baby has very high palate with suck exam. After trying nipple shield attempted the latch at the right breast again and after few attempts baby was able to sustain the latch and demonstrated some good suckling bursts for few minutes with swallowing motions noted. Assisted FOB to give baby the 7ml Mom had pumped. Demonstrated spoon feeding and FOB demonstrated back. Advised Mom to keep working with baby at the breast 8-12 times or more in 24 hours. Advised baby should start sustaining the latch for 15-20 minutes. Post pump to encourage milk production and give baby back any EBM she receives till baby latching consistently. Mom reports some nipple tenderness, no breakdown noted. Care for sore nipples reviewed, comfort gels given with instructions. Encouraged Mom to call for assist as needed.   Maternal Data    Feeding Feeding Type: Breast Milk Length of feed: 5 min  LATCH Score/Interventions Latch: Repeated attempts needed to sustain latch, nipple held in mouth throughout feeding, stimulation needed to elicit sucking reflex. Intervention(s): Adjust position;Assist with latch;Breast massage;Breast compression  Audible Swallowing: A few with stimulation  Type of Nipple: Flat Intervention(s): Double electric pump  Comfort (Breast/Nipple): Filling, red/small blisters or bruises, mild/mod  discomfort  Problem noted: Mild/Moderate discomfort Interventions (Mild/moderate discomfort): Comfort gels;Hand expression;Hand massage  Hold (Positioning): Assistance needed to correctly position infant at breast and maintain latch. Intervention(s): Breastfeeding basics reviewed;Support Pillows;Position options;Skin to skin  LATCH Score: 5  Lactation Tools Discussed/Used Tools: Pump;Comfort gels Nipple shield size: 20;24 Breast pump type: Double-Electric Breast Pump   Consult Status Consult Status: Follow-up Date: 09/30/14 Follow-up type: In-patient    Alfred Levins 09/29/2014, 8:26 PM

## 2014-09-30 MED ORDER — FERROUS SULFATE 325 (65 FE) MG PO TABS: 325.0000 mg | ORAL_TABLET | Freq: Two times a day (BID) | ORAL | Status: AC

## 2014-09-30 MED ORDER — OXYCODONE-ACETAMINOPHEN 5-325 MG PO TABS: 1.0000 | ORAL_TABLET | Freq: Four times a day (QID) | ORAL | Status: DC | PRN

## 2014-09-30 MED ORDER — IBUPROFEN 600 MG PO TABS: 600.0000 mg | ORAL_TABLET | Freq: Four times a day (QID) | ORAL | Status: DC | PRN

## 2014-09-30 NOTE — Progress Notes (Signed)
Patient ID: Bridget Hodges, female   DOB: 1990-10-28, 24 y.o.   MRN: [id number] #[id number] afebrile BP normal No problems for d/c

## 2014-09-30 NOTE — Discharge Summary (Signed)
Bridget Hodges, TOTINO             ACCOUNT NO.:  [id number]  MEDICAL RECORD NO.:  [id number]  LOCATION:  9118                          FACILITY:  [facility]  PHYSICIAN:  Malachi Pro. Ambrose Mantle, M.D. DATE OF BIRTH:  Jul 30, 1990  DATE OF ADMISSION:  09/27/2014 DATE OF DISCHARGE:  09/30/2014                              DISCHARGE SUMMARY   HISTORY OF PRESENT ILLNESS:  This is a 24 year old black female, para 0- 0-1-0, gravida 2 at 39+ weeks gestation with [facility] of October 01, 2014, presenting for evaluation of contractions.  On initial exam, the cervix was 1 cm, with painful contractions.  Re-evaluation, cervix was 3 cm dilated.  After admission to the hospital, the patient received an epidural  at 7:52 a.m. on September 28, 2014.  She had stated most of the previous day in the NAU waiting for bed.  She had spontaneous rupture of membranes and at about 16:30 on September 27, 2014, and the cervix was 3-4 cm dilated.  She received an epidural, progressed to 6 cm and then after that had a protracted labor, and started on a low-dose of Pitocin. Temperature had been as high as 100.5.  On vaginal exam at 7:52 a.m., the cervix was reducible.  There was a rim dilated and completely effaced and the vertex was at +1 station.  The patient was able to push around the way and at 9:04 a.m., a healthy female infant was delivered vaginally with the vacuum extractor from the OA position by Dr. Ellyn Hack. Weight was 8 pounds 1.6 ounces.  Apgars were 8 and 9 at 1 and 5 minutes. No episiotomy was done.  A second-degree laceration was repaired with 3- 0 Vicryl Rapide.  Estimated blood loss was 173 mL.  Postpartum, the patient did well and was discharged on the second postpartum day.  LABORATORY DATA:  Showed an initial hemoglobin of 10.4, hematocrit 31.8, white count 13,700, platelet count 154,000.  Followup hemoglobin 8.1, hematocrit 24.5, white count 22,300, platelet count 121,000.  The patient had no more fever after labor and on the  second postpartum day, she is ready for discharge.  FINAL DIAGNOSES:  Intrauterine pregnancy at 39+ weeks, delivered with vacuum assistance, occiput anterior.  OPERATION:  Vacuum-assisted vaginal delivery OA, repair of second-degree laceration.  FINAL CONDITION:  Improved.  INSTRUCTIONS:  Include our regular discharge instruction booklet as well as our after visit summary.  Prescriptions for Percocet 5/325, 30 tablets, 1 every 6 hours as needed for pain and Motrin 600 mg 30 tablets, 1 every 6 hours as needed for pain.  She is also advised to continue her prenatal vitamins and take ferrous sulfate 325 mg twice a day.  She is advised to consider birth control methods, call the office, setup and appointment for the 6 weeks appointment and advised the receptionist what she plans to use for birth control.  She is also advised to call the office with any problems.    Malachi Pro. Ambrose Mantle, M.D.    TFH/MEDQ  D:  09/30/2014  T:  09/30/2014  Job:  [id number]

## 2014-09-30 NOTE — Discharge Instructions (Signed)
booklet °

## 2014-09-30 NOTE — Lactation Note (Signed)
This note was copied from the chart of Bridget Hodges. Lactation Consultation Note; Mom reports that the baby cluster fed through the night but breast feeding is getting better. She states she has to shove the breast in his mouth and he is latching better. Reports she has pumped a couple of times and dad spoon fed 5-7 ml of EBM to baby. Has Medela pump at home. Reports her breasts are feeling a little fuller this morning. Asking about foods to avoid- reviewed with mom. Encouraged plenty of fluids. Reviewed BFSG and OP appointments as resources for support after DC. To call prn  Patient Name: Bridget Hodges [id number] Date: 09/30/2014 Reason for consult: Follow-up assessment   Maternal Data Formula Feeding for Exclusion: No Has patient been taught Hand Expression?: Yes Does the patient have breastfeeding experience prior to this delivery?: No  Feeding    LATCH Score/Interventions Latch: Repeated attempts needed to sustain latch, nipple held in mouth throughout feeding, stimulation needed to elicit sucking reflex.  Audible Swallowing: A few with stimulation  Type of Nipple: Flat  Comfort (Breast/Nipple): Soft / non-tender     Hold (Positioning): Assistance needed to correctly position infant at breast and maintain latch.  LATCH Score: 6  Lactation Tools Discussed/Used     Consult Status Consult Status: Complete    Pamelia Hoit 09/30/2014, 10:55 AM

## 2014-10-02 ENCOUNTER — Other Ambulatory Visit (HOSPITAL_BASED_OUTPATIENT_CLINIC_OR_DEPARTMENT_OTHER): Payer: BLUE CROSS/BLUE SHIELD

## 2014-10-02 DIAGNOSIS — D509 Iron deficiency anemia, unspecified: Secondary | ICD-10-CM | POA: Diagnosis not present

## 2014-10-02 DIAGNOSIS — D696 Thrombocytopenia, unspecified: Secondary | ICD-10-CM | POA: Diagnosis not present

## 2014-10-02 DIAGNOSIS — E039 Hypothyroidism, unspecified: Secondary | ICD-10-CM | POA: Diagnosis not present

## 2014-10-02 DIAGNOSIS — O99119 Other diseases of the blood and blood-forming organs and certain disorders involving the immune mechanism complicating pregnancy, unspecified trimester: Secondary | ICD-10-CM

## 2014-10-02 DIAGNOSIS — D649 Anemia, unspecified: Secondary | ICD-10-CM

## 2014-10-02 LAB — CBC & DIFF AND RETIC
BASO%: 0.4 % (ref 0.0–2.0)
Basophils Absolute: 0.1 10*3/uL (ref 0.0–0.1)
EOS%: 2.4 % (ref 0.0–7.0)
Eosinophils Absolute: 0.3 10*3/uL (ref 0.0–0.5)
HCT: 27.4 % — ABNORMAL LOW (ref 34.8–46.6)
HGB: 8.9 g/dL — ABNORMAL LOW (ref 11.6–15.9)
IMMATURE RETIC FRACT: 24.5 % — AB (ref 1.60–10.00)
LYMPH%: 15.1 % (ref 14.0–49.7)
MCH: 26.6 pg (ref 25.1–34.0)
MCHC: 32.5 g/dL (ref 31.5–36.0)
MCV: 82 fL (ref 79.5–101.0)
MONO#: 0.7 10*3/uL (ref 0.1–0.9)
MONO%: 5.6 % (ref 0.0–14.0)
NEUT%: 76.5 % (ref 38.4–76.8)
NEUTROS ABS: 9.4 10*3/uL — AB (ref 1.5–6.5)
PLATELETS: 182 10*3/uL (ref 145–400)
RBC: 3.34 10*6/uL — ABNORMAL LOW (ref 3.70–5.45)
RDW: 15.6 % — AB (ref 11.2–14.5)
RETIC %: 2.82 % — AB (ref 0.70–2.10)
RETIC CT ABS: 94.19 10*3/uL — AB (ref 33.70–90.70)
WBC: 12.2 10*3/uL — AB (ref 3.9–10.3)
lymph#: 1.9 10*3/uL (ref 0.9–3.3)

## 2014-10-09 ENCOUNTER — Other Ambulatory Visit: Payer: BLUE CROSS/BLUE SHIELD

## 2014-12-01 ENCOUNTER — Other Ambulatory Visit: Payer: BLUE CROSS/BLUE SHIELD

## 2014-12-01 ENCOUNTER — Encounter: Payer: BLUE CROSS/BLUE SHIELD | Admitting: Hematology

## 2014-12-02 NOTE — Progress Notes (Signed)
This encounter was created in error - please disregard.

## 2014-12-04 ENCOUNTER — Telehealth: Payer: Self-pay | Admitting: Hematology

## 2014-12-04 NOTE — Telephone Encounter (Signed)
s.w. pt and gv new appt.....pt ok and aware of new d.t °

## 2015-01-01 ENCOUNTER — Other Ambulatory Visit: Payer: BLUE CROSS/BLUE SHIELD

## 2015-01-01 ENCOUNTER — Ambulatory Visit: Payer: BLUE CROSS/BLUE SHIELD | Admitting: Hematology

## 2015-10-28 IMAGING — US US OB LIMITED
1 series · 14 of 26 positions shown · non-contrast
Comparison: none

CLINICAL DATA: Vaginal bleeding in second trimester pregnancy.

EXAM:
LIMITED OBSTETRIC ULTRASOUND AND TRANSVAGINAL OBSTETRIC ULTRASOUND

[Series 1: us ob limited · 0.22mm/px · 14 of 26 slices shown]
[im 1/26]
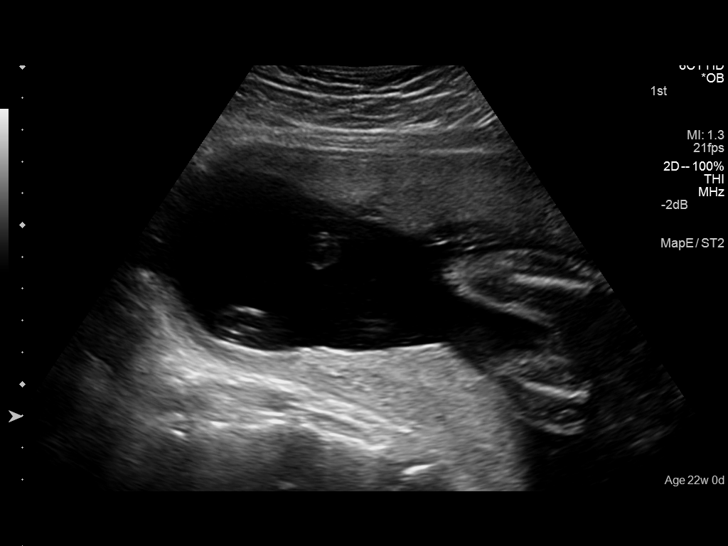
[im 3/26]
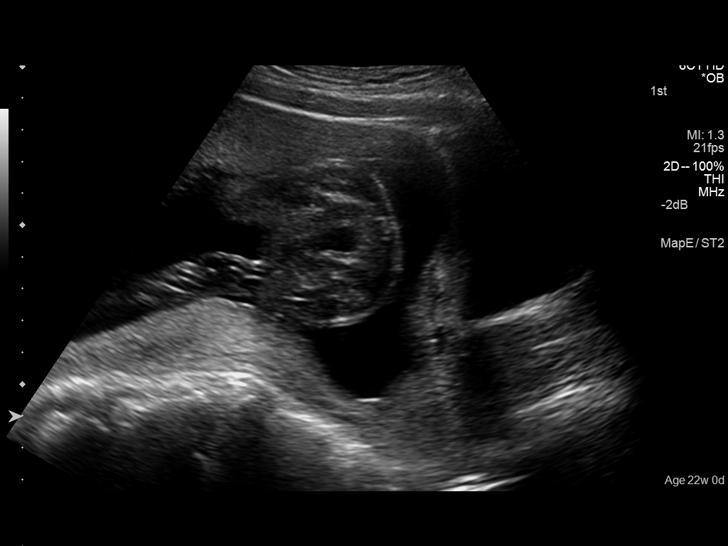
[im 5/26]
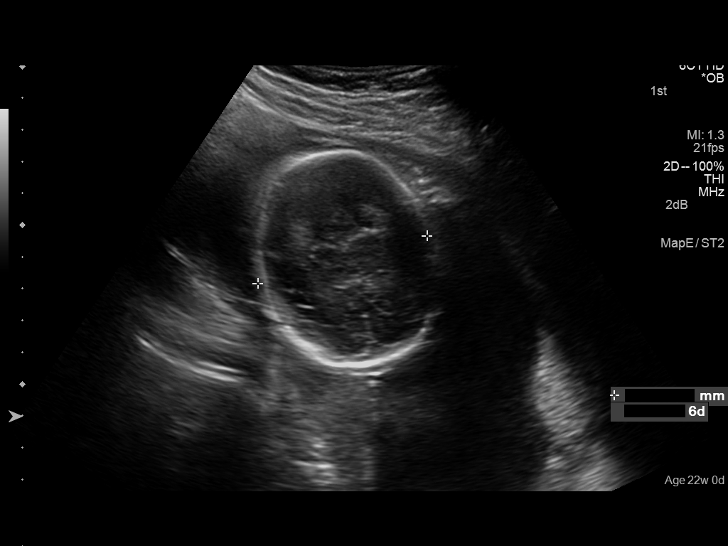
[im 7/26]
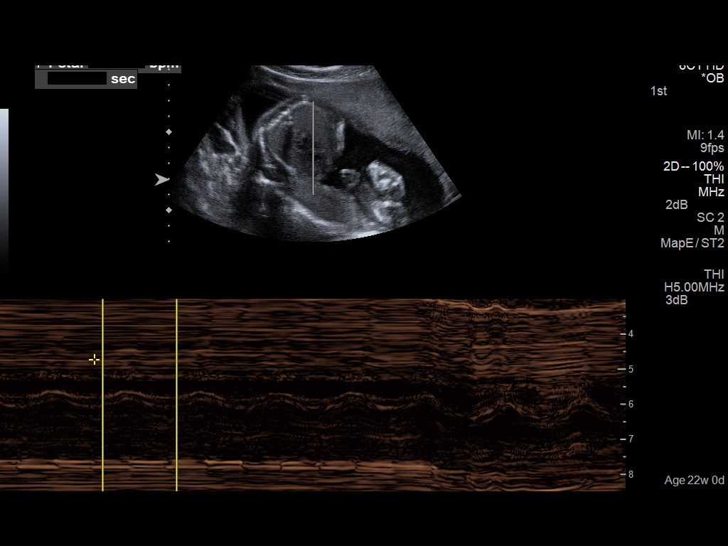
[im 9/26]
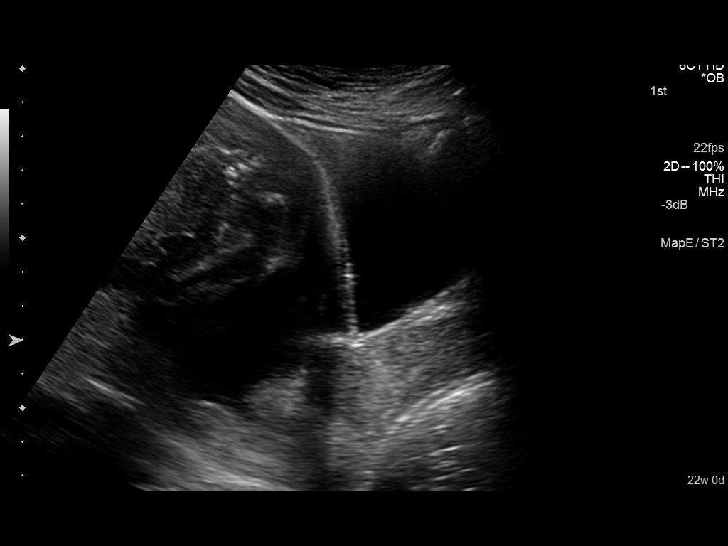
[im 11/26]
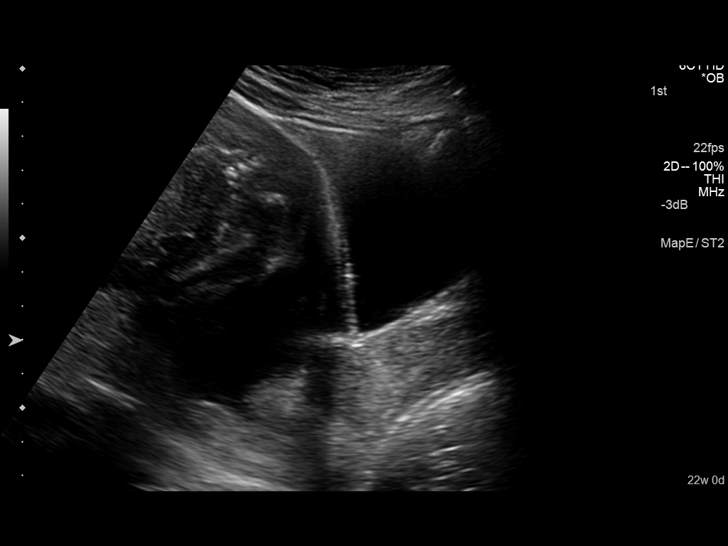
[im 13/26]
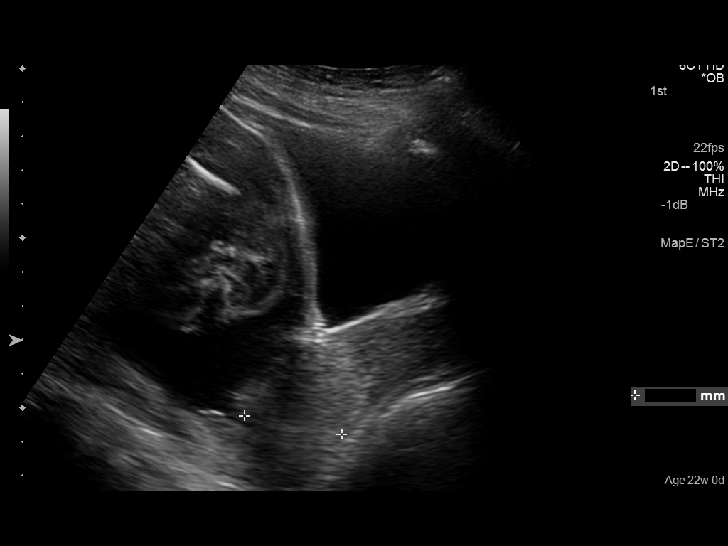
[im 14/26]
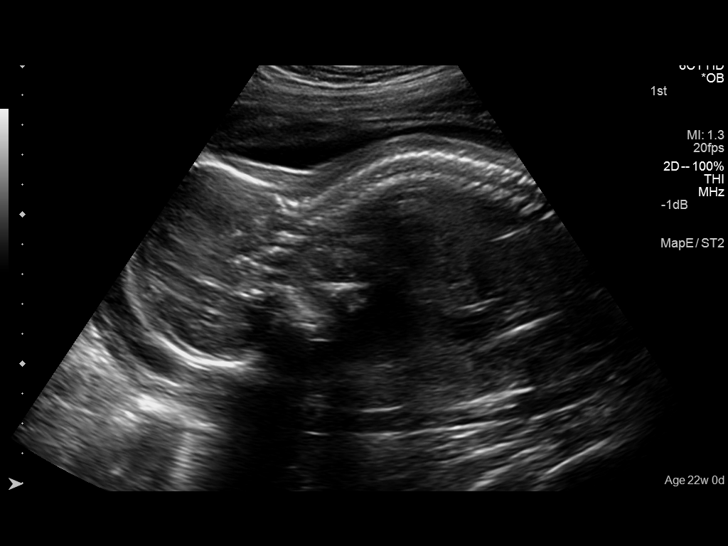
[im 16/26]
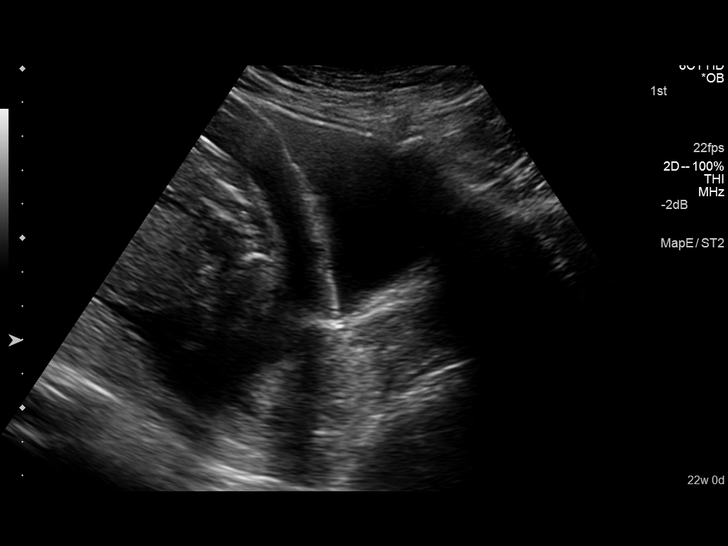
[im 18/26]
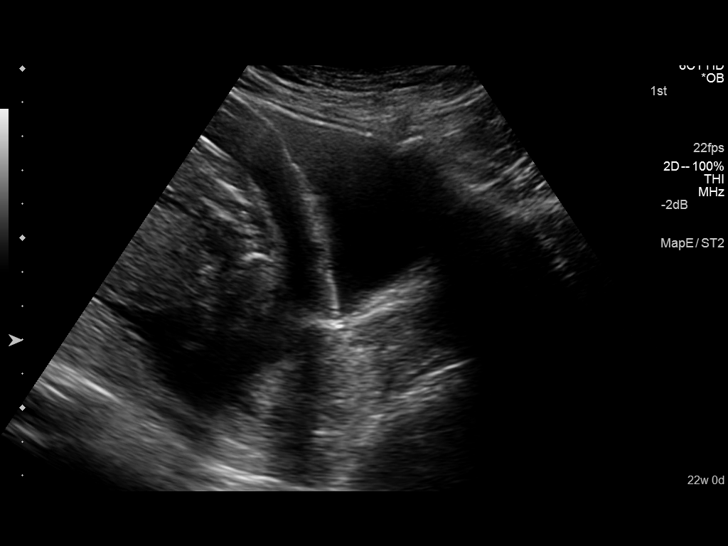
[im 20/26]
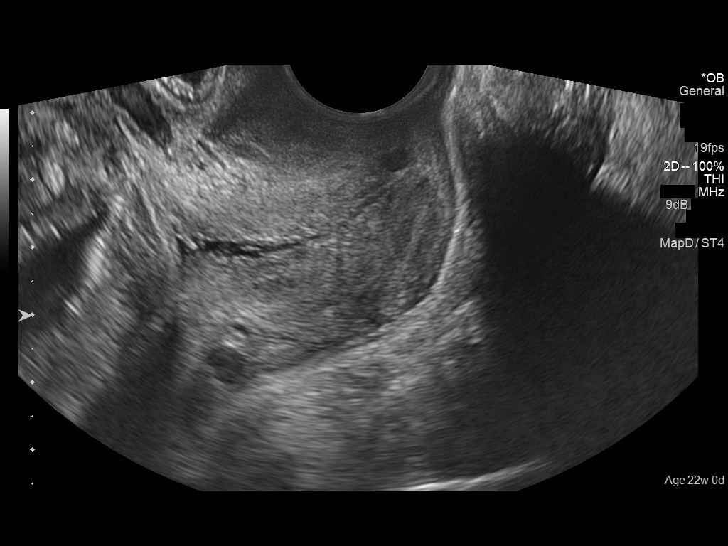
[im 22/26]
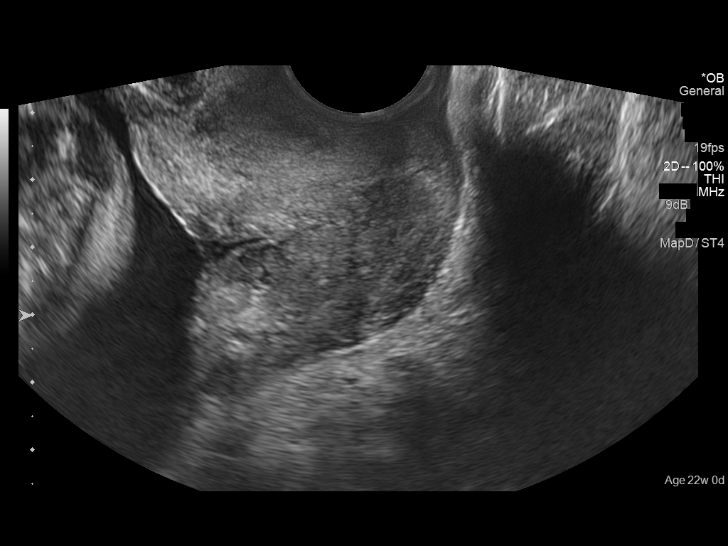
[im 24/26]
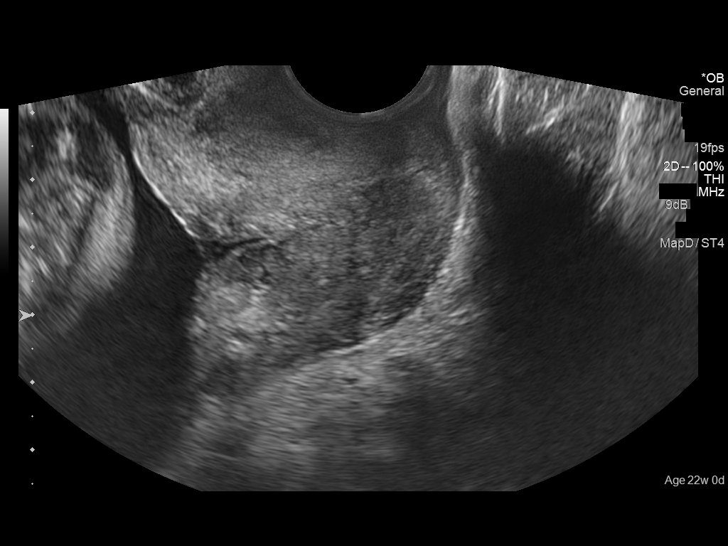
[im 26/26]
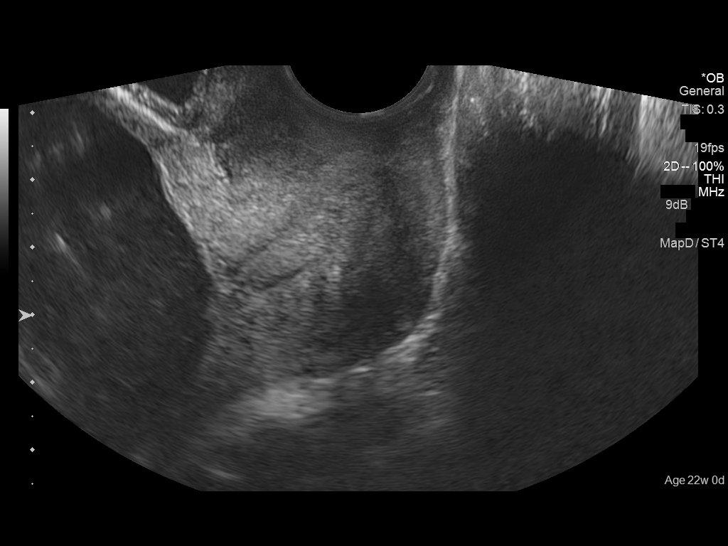

[14 of 26 positions shown; findings below may reference images not displayed]

FINDINGS: Number of Fetuses: 1

Heart Rate:  131 bpm

Movement: Yes

Presentation: Breech

Placental Location: Anterior

Previa:  No

Amniotic Fluid (Subjective):  Within normal limits.

BPD:  5.5cm 22w  6d

MATERNAL FINDINGS:

Cervix: Small amount of fluid in the cervical canal. Cervix measures
3.8 cm, mild falling is noted at 2.8 cm.

Uterus/Adnexae:  No abnormality visualized.
IMPRESSION: 1. Single live intrauterine pregnancy in breech presentation.
Estimated gestational age 22 weeks 6 days for estimated date of
delivery 09/25/2014.
2. Minimal fluid in the cervical canal with funneling of the cervix
at 2.8 cm.

This exam is performed on an emergent basis and does not
comprehensively evaluate fetal size, dating, or anatomy; follow-up
complete OB US should be considered if further fetal assessment is
warranted.

## 2019-02-28 ENCOUNTER — Ambulatory Visit: Payer: BLUE CROSS/BLUE SHIELD | Attending: Internal Medicine

## 2019-02-28 DIAGNOSIS — Z20822 Contact with and (suspected) exposure to covid-19: Secondary | ICD-10-CM

## 2019-03-02 LAB — NOVEL CORONAVIRUS, NAA: SARS-CoV-2, NAA: NOT DETECTED

## 2019-09-28 ENCOUNTER — Other Ambulatory Visit: Payer: BLUE CROSS/BLUE SHIELD

## 2020-03-13 DIAGNOSIS — U071 COVID-19: Secondary | ICD-10-CM | POA: Diagnosis not present

## 2020-03-14 ENCOUNTER — Other Ambulatory Visit: Payer: BLUE CROSS/BLUE SHIELD

## 2020-03-17 ENCOUNTER — Other Ambulatory Visit: Payer: BLUE CROSS/BLUE SHIELD

## 2021-08-26 DIAGNOSIS — R350 Frequency of micturition: Secondary | ICD-10-CM | POA: Diagnosis not present

## 2021-08-26 DIAGNOSIS — R1030 Lower abdominal pain, unspecified: Secondary | ICD-10-CM | POA: Diagnosis not present

## 2021-10-28 DIAGNOSIS — Z113 Encounter for screening for infections with a predominantly sexual mode of transmission: Secondary | ICD-10-CM | POA: Diagnosis not present

## 2021-10-28 DIAGNOSIS — Z114 Encounter for screening for human immunodeficiency virus [HIV]: Secondary | ICD-10-CM | POA: Diagnosis not present

## 2021-10-28 DIAGNOSIS — F4322 Adjustment disorder with anxiety: Secondary | ICD-10-CM | POA: Diagnosis not present

## 2021-10-28 DIAGNOSIS — R112 Nausea with vomiting, unspecified: Secondary | ICD-10-CM | POA: Diagnosis not present

## 2021-10-28 DIAGNOSIS — Z0000 Encounter for general adult medical examination without abnormal findings: Secondary | ICD-10-CM | POA: Diagnosis not present

## 2021-10-28 DIAGNOSIS — Z118 Encounter for screening for other infectious and parasitic diseases: Secondary | ICD-10-CM | POA: Diagnosis not present

## 2021-10-28 DIAGNOSIS — Z20822 Contact with and (suspected) exposure to covid-19: Secondary | ICD-10-CM | POA: Diagnosis not present

## 2021-10-28 DIAGNOSIS — R059 Cough, unspecified: Secondary | ICD-10-CM | POA: Diagnosis not present

## 2022-03-13 DIAGNOSIS — R509 Fever, unspecified: Secondary | ICD-10-CM | POA: Diagnosis not present

## 2023-01-03 ENCOUNTER — Encounter (HOSPITAL_BASED_OUTPATIENT_CLINIC_OR_DEPARTMENT_OTHER): Payer: Self-pay

## 2023-01-03 ENCOUNTER — Emergency Department (HOSPITAL_BASED_OUTPATIENT_CLINIC_OR_DEPARTMENT_OTHER): Payer: BC Managed Care – PPO

## 2023-01-03 ENCOUNTER — Emergency Department (HOSPITAL_BASED_OUTPATIENT_CLINIC_OR_DEPARTMENT_OTHER)
Admission: EM | Admit: 2023-01-03 | Discharge: 2023-01-03 | Disposition: A | Discharge status: Home / Self Care | Payer: BC Managed Care – PPO | Attending: Emergency Medicine | Admitting: Emergency Medicine

## 2023-01-03 DIAGNOSIS — E039 Hypothyroidism, unspecified: Secondary | ICD-10-CM | POA: Insufficient documentation

## 2023-01-03 DIAGNOSIS — N83201 Unspecified ovarian cyst, right side: Secondary | ICD-10-CM | POA: Insufficient documentation

## 2023-01-03 DIAGNOSIS — R109 Unspecified abdominal pain: Secondary | ICD-10-CM | POA: Diagnosis not present

## 2023-01-03 DIAGNOSIS — R1032 Left lower quadrant pain: Secondary | ICD-10-CM | POA: Diagnosis not present

## 2023-01-03 LAB — CBC WITH DIFFERENTIAL/PLATELET
Abs Immature Granulocytes: 0.03 10*3/uL (ref 0.00–0.07)
Basophils Absolute: 0.1 10*3/uL (ref 0.0–0.1)
Basophils Relative: 1 %
Eosinophils Absolute: 0.2 10*3/uL (ref 0.0–0.5)
Eosinophils Relative: 2 %
HCT: 39.7 % (ref 36.0–46.0)
Hemoglobin: 13.6 g/dL (ref 12.0–15.0)
Immature Granulocytes: 0 %
Lymphocytes Relative: 10 %
Lymphs Abs: 1.1 10*3/uL (ref 0.7–4.0)
MCH: 30.2 pg (ref 26.0–34.0)
MCHC: 34.3 g/dL (ref 30.0–36.0)
MCV: 88.2 fL (ref 80.0–100.0)
Monocytes Absolute: 0.4 10*3/uL (ref 0.1–1.0)
Monocytes Relative: 4 %
Neutro Abs: 8.7 10*3/uL — ABNORMAL HIGH (ref 1.7–7.7)
Neutrophils Relative %: 83 %
Platelets: 137 10*3/uL — ABNORMAL LOW (ref 150–400)
RBC: 4.5 MIL/uL (ref 3.87–5.11)
RDW: 12.7 % (ref 11.5–15.5)
WBC: 10.4 10*3/uL (ref 4.0–10.5)
nRBC: 0 % (ref 0.0–0.2)

## 2023-01-03 LAB — URINALYSIS, ROUTINE W REFLEX MICROSCOPIC
Bacteria, UA: NONE SEEN
Bilirubin Urine: NEGATIVE
Glucose, UA: NEGATIVE mg/dL
Hgb urine dipstick: NEGATIVE
Ketones, ur: 15 mg/dL — AB
Nitrite: NEGATIVE
Protein, ur: NEGATIVE mg/dL
Specific Gravity, Urine: 1.028 (ref 1.005–1.030)
pH: 6.5 (ref 5.0–8.0)

## 2023-01-03 LAB — BASIC METABOLIC PANEL
Anion gap: 8 (ref 5–15)
BUN: 12 mg/dL (ref 6–20)
CO2: 25 mmol/L (ref 22–32)
Calcium: 9.8 mg/dL (ref 8.9–10.3)
Chloride: 102 mmol/L (ref 98–111)
Creatinine, Ser: 0.78 mg/dL (ref 0.44–1.00)
GFR, Estimated: >60 mL/min (ref >60)
Glucose, Bld: 92 mg/dL (ref 70–99)
Potassium: 3.8 mmol/L (ref 3.5–5.1)
Sodium: 135 mmol/L (ref 135–145)

## 2023-01-03 LAB — HCG, QUANTITATIVE, PREGNANCY: hCG, Beta Chain, Quant, S: <1 m[IU]/mL (ref <5)

## 2023-01-03 MED ORDER — ONDANSETRON HCL 4 MG PO TABS: 4.0000 mg | ORAL_TABLET | Freq: Four times a day (QID) | ORAL | 0 refills | Status: AC

## 2023-01-03 MED ORDER — FENTANYL CITRATE PF 50 MCG/ML IJ SOSY
50.0000 ug | PREFILLED_SYRINGE | Freq: Once | INTRAMUSCULAR | Status: DC
Start: 2023-01-03 — End: 2023-01-03

## 2023-01-03 MED ORDER — OXYCODONE-ACETAMINOPHEN 5-325 MG PO TABS: 1.0000 | ORAL_TABLET | Freq: Four times a day (QID) | ORAL | 0 refills | Status: AC | PRN

## 2023-01-03 MED ORDER — ONDANSETRON HCL 4 MG PO TABS: 4.0000 mg | ORAL_TABLET | Freq: Four times a day (QID) | ORAL | 0 refills | Status: DC

## 2023-01-03 MED ORDER — MORPHINE SULFATE (PF) 4 MG/ML IV SOLN
4.0000 mg | Freq: Once | INTRAVENOUS | Status: AC
Start: 2023-01-03 — End: 2023-01-03
  Administered 2023-01-03: 4 mg via INTRAVENOUS
  Filled 2023-01-03: qty 1

## 2023-01-03 MED ORDER — MORPHINE SULFATE (PF) 4 MG/ML IV SOLN
4.0000 mg | Freq: Once | INTRAVENOUS | Status: AC
  Administered 2023-01-03: 4 mg via INTRAVENOUS
  Filled 2023-01-03: qty 1

## 2023-01-03 MED ORDER — OXYCODONE-ACETAMINOPHEN 5-325 MG PO TABS: 1.0000 | ORAL_TABLET | Freq: Four times a day (QID) | ORAL | 0 refills | Status: DC | PRN

## 2023-01-03 NOTE — ED Provider Notes (Signed)
[place] [facility] AT [facility] Provider Note   CSN: [id number] Arrival date & time: 01/03/23  0865     History  Chief Complaint  Patient presents with   Flank Pain    KIYO HEAL is a 32 y.o. female history of hypothyroid thyroidism presented with left flank pain for the past day.  Patient notices it yesterday when she was running however comes and goes and has been sharp however this morning was worsening so she went to the urgent care and was told to come to the ER she had "an acute colon" due to a kidney stone.  Patient does endorse nausea vomiting over the past day as well and was given nausea meds at the urgent care which have been helping slightly.  Patient was not given any pain meds does not take any pain meds.  Patient denies any hematuria, vaginal bleeding, vaginal discharge, fevers, chest pain, shortness of breath.  Patient states that the pain starts in her left flank and radiates down to her groin and denies history of kidney stones.   Home Medications Prior to Admission medications   Medication Sig Start Date End Date Taking? Authorizing Provider  ondansetron (ZOFRAN) 4 MG tablet Take 1 tablet (4 mg total) by mouth every 6 (six) hours. 01/03/23  Yes Netta Corrigan, PA-C  oxyCODONE-acetaminophen (PERCOCET/ROXICET) 5-325 MG tablet Take 1 tablet by mouth every 6 (six) hours as needed for up to 5 days for severe pain (pain score 7-10). 01/03/23 01/08/23 Yes Ramina Hulet, Beverly Gust, PA-C  albuterol (PROVENTIL HFA;VENTOLIN HFA) 108 (90 BASE) MCG/ACT inhaler Inhale 2 puffs into the lungs every 6 (six) hours as needed for wheezing or shortness of breath. 08/02/14   Veryl Speak, FNP  calcium carbonate (TUMS - DOSED IN MG ELEMENTAL CALCIUM) 500 MG chewable tablet Chew 1 tablet by mouth daily as needed for indigestion or heartburn.    [provider]  ferrous sulfate 325 (65 FE) MG tablet Take 1 tablet (325 mg total) by mouth 2 (two) times  daily with a meal. 09/30/14   Tracey Harries, MD  ibuprofen (ADVIL,MOTRIN) 600 MG tablet Take 1 tablet (600 mg total) by mouth every 6 (six) hours as needed. 09/30/14   Tracey Harries, MD  Prenatal Vit-Fe Fumarate-FA (PRENATAL MULTIVITAMIN) TABS tablet Take 1 tablet by mouth daily.     [provider]      Allergies    Penicillins    Review of Systems   Review of Systems  Physical Exam Updated Vital Signs BP 111/67 (BP Location: Right Arm)   Pulse 68   Temp 98.2 F (36.8 C) (Oral)   Resp 18   Ht 4' 9.75" (1.467 m)   Wt 59 kg   LMP 12/26/2022 (Exact Date)   SpO2 98%   BMI 27.41 kg/m  Physical Exam Vitals reviewed.  Constitutional:      General: She is in acute distress.     Comments: Tearful on exam  HENT:     Head: Normocephalic and atraumatic.  Eyes:     Extraocular Movements: Extraocular movements intact.     Conjunctiva/sclera: Conjunctivae normal.     Pupils: Pupils are equal, round, and reactive to light.  Cardiovascular:     Rate and Rhythm: Normal rate and regular rhythm.     Pulses: Normal pulses.     Heart sounds: Normal heart sounds.     Comments: 2+ bilateral radial/dorsalis pedis pulses with regular rate Pulmonary:     Effort:  Pulmonary effort is normal. No respiratory distress.     Breath sounds: Normal breath sounds.  Abdominal:     Palpations: Abdomen is soft.     Tenderness: There is no abdominal tenderness. There is left CVA tenderness. There is no right CVA tenderness, guarding or rebound.  Musculoskeletal:        General: Normal range of motion.     Cervical back: Normal range of motion and neck supple.     Comments: 5 out of 5 bilateral grip/leg extension strength  Skin:    General: Skin is warm and dry.     Capillary Refill: Capillary refill takes less than 2 seconds.  Neurological:     General: No focal deficit present.     Mental Status: She is alert and oriented to person, place, and time.     Comments: Sensation intact in all 4  limbs  Psychiatric:        Mood and Affect: Mood normal.     ED Results / Procedures / Treatments   Labs (all labs ordered are listed, but only abnormal results are displayed) Labs Reviewed  CBC WITH DIFFERENTIAL/PLATELET - Abnormal; Notable for the following components:      Result Value   Platelets 137 (*)    Neutro Abs 8.7 (*)    All other components within normal limits  URINALYSIS, ROUTINE W REFLEX MICROSCOPIC - Abnormal; Notable for the following components:   Ketones, ur 15 (*)    Leukocytes,Ua TRACE (*)    All other components within normal limits  BASIC METABOLIC PANEL  HCG, QUANTITATIVE, PREGNANCY    EKG None  Radiology US PELVIC COMPLETE W TRANSVAGINAL AND TORSION R/O  Result Date: 01/03/2023 CLINICAL DATA:  Left lower quadrant pain.  Abnormal CT EXAM: TRANSABDOMINAL AND TRANSVAGINAL ULTRASOUND OF PELVIS DOPPLER ULTRASOUND OF OVARIES TECHNIQUE: Both transabdominal and transvaginal ultrasound examinations of the pelvis were performed. Transabdominal technique was performed for global imaging of the pelvis including uterus, ovaries, adnexal regions, and pelvic cul-de-sac. It was necessary to proceed with endovaginal exam following the transabdominal exam to visualize the ovaries. Color and duplex Doppler ultrasound was utilized to evaluate blood flow to the ovaries. COMPARISON:  CT 01/03/2023 FINDINGS: Uterus Measurements: 8.0 x 3.3 x 3.8 cm = volume: 52 mL. No fibroids or other mass visualized. Endometrium Thickness: 5 mm.  No focal abnormality visualized. Right ovary Measurements: 3.1 x 2.2 x 1.8 cm = volume: 6.5 mL. Complex mixed solid and cystic mass centered in the right adnexa measures approximately 8.9 x 6.5 x 5.1 cm. Internal vascularity is present within the mass. The dominant cystic component measures up to 8 cm in diameter. Small amount of shadowing calcification within the mass. Left ovary Not visualized. Pulsed Doppler evaluation of the right ovary demonstrates  normal low-resistance arterial and venous waveforms. Other findings Small volume free fluid within the pelvis. IMPRESSION: 1. Complex mixed solid and cystic mass centered in the right adnexa measures up to 8.9 cm. 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 Nonemergent gynecologic consultation is recommended. 2. No evidence of right ovarian torsion. 3. Nonvisualization of the left ovary. 4. Small volume free fluid within the pelvis. Electronically Signed   By: Duanne Guess D.O.   On: 01/03/2023 14:13   CT Renal Stone Study  Result Date: 01/03/2023 CLINICAL DATA:  Abdominal/flank pain, stone suspected. EXAM: CT ABDOMEN AND PELVIS WITHOUT CONTRAST TECHNIQUE: Multidetector CT imaging of the abdomen and pelvis was performed following the standard protocol without  IV contrast. RADIATION DOSE REDUCTION: This exam was performed according to the departmental dose-optimization program which includes automated exposure control, adjustment of the mA and/or kV according to patient size and/or use of iterative reconstruction technique. COMPARISON:  None Available. FINDINGS: Lower chest: Clear lung bases. No significant pleural or pericardial effusion. Hepatobiliary: The liver appears unremarkable as imaged in the noncontrast state. No evidence of gallstones, gallbladder wall thickening or biliary dilatation. Pancreas: Unremarkable. No pancreatic ductal dilatation or surrounding inflammatory changes. Spleen: Normal in size without focal abnormality. Adrenals/Urinary Tract: Both adrenal glands appear normal. Mildly increased density within the region of the renal pyramids bilaterally. No discrete the urinary tract calculi. No hydronephrosis or perinephric soft tissue stranding. The bladder is nearly empty and suboptimally evaluated. No gross abnormality is seen. Stomach/Bowel: No enteric contrast administered. The stomach appears unremarkable for its degree of distension. No evidence of bowel wall  thickening, distention or surrounding inflammatory change. The appendix appears normal. Vascular/Lymphatic: There are no enlarged abdominal or pelvic lymph nodes. No significant vascular findings on noncontrast imaging. Reproductive: There is a large mixed attenuation right adnexal mass which measures approximately 7.0 x 5.8 by 8.7 cm. This has a high density (74 HU) component superiorly measuring 3.1 cm on image 52/2. There is a larger predominantly fluid attenuation component measuring approximately 7.0 x 5.8 cm. Inferiorly and on the left, there are fatty components and calcifications. 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 There is mild surrounding soft tissue stranding, and ovarian torsion cannot be excluded. No suspicious left adnexal or uterine findings. Other: No evidence of abdominal wall mass or hernia. No ascites or pneumoperitoneum. Musculoskeletal: No acute or significant osseous findings. IMPRESSION: 1. 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 There is mild surrounding soft tissue stranding, and ovarian torsion cannot be excluded. Recommend further evaluation with pelvic ultrasound. If not surgically removed, recommend annual follow-up imaging to assess stability. 2. No evidence of urinary tract calculus or hydronephrosis. 3. Normal appendix. Electronically Signed   By: Carey Bullocks M.D.   On: 01/03/2023 12:38    Procedures Procedures    Medications Ordered in ED Medications  morphine (PF) 4 MG/ML injection 4 mg (4 mg Intravenous Given 01/03/23 1023)  morphine (PF) 4 MG/ML injection 4 mg (4 mg Intravenous Given 01/03/23 1320)    ED Course/ Medical Decision Making/ A&P                                 Medical Decision Making Amount and/or Complexity of Data Reviewed Labs: ordered. Radiology: ordered.  Risk Prescription drug management.   Araceli Bouche Alcivar-Castillo 32 y.o. presented today for flank pain. Working  DDx that I considered at this time includes, but not limited to, MSK, nephrolithiasis, pyelonephritis, AAA, aortic dissection, mesenteric ischemia, RCC, obstructive uropathy, renal infarct/hemorrhage, tumor, biliary colic, pancreatitis, appendicitis, SBO, diverticulitis, shingles, lower lobe pneumonia, ectopic pregnancy, PID/TOA, ovarian torsion, endometriosis.  R/o DDx: MSK, nephrolithiasis, pyelonephritis, AAA, aortic dissection, mesenteric ischemia, RCC, obstructive uropathy, renal infarct/hemorrhage, tumor, biliary colic, pancreatitis, appendicitis, SBO, diverticulitis, shingles, lower lobe pneumonia, ectopic pregnancy, PID/TOA, ovarian torsion, endometriosis: These are considered less likely due to history of present illness, physical exam, lab/imaging findings  Review of prior external notes: 01/03/2023 urgent care  Unique Tests and My Interpretation:  CBC: Unremarkable [id number] UA: Unremarkable hCG quantitative: Negative CT renal stone study: Large right adnexal mass that will need ultrasound  Pelvic ultrasound: 8.9 cm right adnexal mass with no signs of torsion but recommend GYN consultation  Social Determinants of Health: none  Discussion with Independent Historian: None  Discussion of Management of Tests:  Law, MD OBGYN  Risk: Medium: prescription drug management  Risk Stratification Score: None  Plan: On exam patient was in distress with stable vitals.  Patient was tearful on exam due to pain and did have left CVA tenderness but otherwise had reassuring physical exam.  Patient's discharge summary from the urgent care states that she has "an acute colon from kidney stone" however patient does not have any peritoneal signs or signs of surgical abdomen and had reassuring x-rays done at the urgent care they do not show free air and so not concern for acute abdomen at this time as patient's symptoms sound classic for a kidney stone.  Will obtain basic labs and give pain meds  along with imaging to further assess.  Patient labs are unremarkable however CT scan does show a large right dermoid cyst but unable to rule out torsion and so pelvic ultrasound will be ordered.  We went to go evaluate the patient patient states she was doing better but that the pain was coming back and requested another round of morphine which is reasonable at this time so we will order this.  Unsure if patient's left flank pain at this time is patient's dermoid cyst was found on the right side however this could be radiation will further assess with the ultrasound.  Ultrasound shows a 0.9 right adnexal dermoid cyst and recommends GYN consultation.  Does not appear there is any sign of torsion on the ultrasound but will consult GYN to see if patient could be seen for this mass.  I spoke to the GYN on-call and they state that they will get the patient into be evaluated at some point next week and that they do not believe patient needs to be admitted at this time as her pain is controlled.  On recheck patient was resting comfortably and states that her pain is well-controlled and wants to be discharged.  Will discharge with OB/GYN follow-up.  Encouraged Tylenol or ibuprofen every 6 hours needed for pain however will give Percocet for pain not controlled by this along with Zofran for any nausea she might have.  Patient was given return precautions. Patient stable for discharge at this time.  Patient verbalized understanding of plan.  This chart was dictated using voice recognition software.  Despite best efforts to proofread,  errors can occur which can change the documentation meaning.         Final Clinical Impression(s) / ED Diagnoses Final diagnoses:  Right ovarian cyst    Rx / DC Orders ED Discharge Orders          Ordered    ondansetron (ZOFRAN) 4 MG tablet  Every 6 hours        01/03/23 1544    oxyCODONE-acetaminophen (PERCOCET/ROXICET) 5-325 MG tablet  Every 6 hours PRN         01/03/23 1544              Netta Corrigan, PA-C 01/03/23 1554    Ernie Avena, MD 01/04/23 [id number]

## 2023-01-03 NOTE — ED Notes (Signed)
Pt unable to provide urine sample at this time, will try again later.

## 2023-01-03 NOTE — ED Notes (Signed)
As I write this, she is undergoing u/s in the imaging department.

## 2023-01-03 NOTE — ED Triage Notes (Signed)
She c/o intermittent left flank pain x 2 days--became worse and persistent at 0700 today.

## 2023-01-03 NOTE — Discharge Instructions (Signed)
Follow-up with your OB/GYN next week in regards to recent symptoms and ER visit.  Today your CT and ultrasound show an 8.9 cm right ovarian mass that could be contributing to your pain.  You may take Tylenol ibuprofen every 6 hours needed for pain however prescribe you Percocet to take for pain not controlled by this.  I have also prescribed for you Zofran to help with any nausea might take.  Please monitor your symptoms symptoms change or worsen please return to ER.

## 2023-01-20 ENCOUNTER — Other Ambulatory Visit: Payer: Self-pay | Admitting: Obstetrics and Gynecology

## 2023-01-26 ENCOUNTER — Encounter (HOSPITAL_COMMUNITY): Payer: Self-pay

## 2023-01-27 ENCOUNTER — Encounter (HOSPITAL_COMMUNITY): Payer: Self-pay | Admitting: *Deleted

## 2023-01-27 NOTE — Progress Notes (Signed)
Spoke w/ via phone for pre-op interview--- Fausto Skillern Lab needs dos----   CBC,T&S,BMP and HCG Serum  per surgeon    Lab results------ COVID test -----patient states asymptomatic no test needed Arrive at -------1100 NPO after MN NO Solid Food.  Clear liquids from MN until---1000 Med rec completed Medications to take morning of surgery -----Bring Albuterol Diabetic medication ----- Patient instructed no nail polish to be worn day of surgery Patient instructed to bring photo id and insurance card day of surgery Patient aware to have Driver (ride ) / caregiver    for 24 hours after surgery - Mother Virgel Bouquet Patient Special Instructions ----- Pre-Op special Instructions ----- Patient verbalized understanding of instructions that were given at this phone interview. Patient denies chest pain, sob, fever, cough at the interview.

## 2023-02-09 ENCOUNTER — Other Ambulatory Visit: Payer: Self-pay

## 2023-02-09 ENCOUNTER — Encounter (HOSPITAL_BASED_OUTPATIENT_CLINIC_OR_DEPARTMENT_OTHER)
Admission: RE | Disposition: A | Discharge status: Home / Self Care | Payer: Self-pay | Source: Home / Self Care | Attending: Obstetrics and Gynecology

## 2023-02-09 ENCOUNTER — Ambulatory Visit (HOSPITAL_BASED_OUTPATIENT_CLINIC_OR_DEPARTMENT_OTHER)
Admission: RE | Admit: 2023-02-09 | Discharge: 2023-02-09 | Disposition: A | Discharge status: Home / Self Care | Payer: BC Managed Care – PPO | Attending: Obstetrics and Gynecology | Admitting: Obstetrics and Gynecology

## 2023-02-09 ENCOUNTER — Ambulatory Visit (HOSPITAL_BASED_OUTPATIENT_CLINIC_OR_DEPARTMENT_OTHER): Payer: BC Managed Care – PPO | Admitting: Anesthesiology

## 2023-02-09 ENCOUNTER — Encounter (HOSPITAL_BASED_OUTPATIENT_CLINIC_OR_DEPARTMENT_OTHER): Payer: Self-pay | Admitting: Obstetrics and Gynecology

## 2023-02-09 DIAGNOSIS — F1290 Cannabis use, unspecified, uncomplicated: Secondary | ICD-10-CM | POA: Diagnosis not present

## 2023-02-09 DIAGNOSIS — J45909 Unspecified asthma, uncomplicated: Secondary | ICD-10-CM | POA: Insufficient documentation

## 2023-02-09 DIAGNOSIS — D271 Benign neoplasm of left ovary: Secondary | ICD-10-CM | POA: Insufficient documentation

## 2023-02-09 LAB — POCT I-STAT, CHEM 8
BUN: 12 mg/dL (ref 6–20)
Calcium, Ion: 1.16 mmol/L (ref 1.15–1.40)
Chloride: 104 mmol/L (ref 98–111)
Creatinine, Ser: 0.7 mg/dL (ref 0.44–1.00)
Glucose, Bld: 86 mg/dL (ref 70–99)
HCT: 41 % (ref 36.0–46.0)
Hemoglobin: 13.9 g/dL (ref 12.0–15.0)
Potassium: 3.8 mmol/L (ref 3.5–5.1)
Sodium: 137 mmol/L (ref 135–145)
TCO2: 23 mmol/L (ref 22–32)

## 2023-02-09 LAB — CBC
HCT: 39.6 % (ref 36.0–46.0)
Hemoglobin: 13.5 g/dL (ref 12.0–15.0)
MCH: 30.6 pg (ref 26.0–34.0)
MCHC: 34.1 g/dL (ref 30.0–36.0)
MCV: 89.8 fL (ref 80.0–100.0)
Platelets: 156 10*3/uL (ref 150–400)
RBC: 4.41 MIL/uL (ref 3.87–5.11)
RDW: 12.1 % (ref 11.5–15.5)
WBC: 7.3 10*3/uL (ref 4.0–10.5)
nRBC: 0 % (ref 0.0–0.2)

## 2023-02-09 LAB — TYPE AND SCREEN
ABO/RH(D): A POS
Antibody Screen: NEGATIVE

## 2023-02-09 LAB — HCG, SERUM, QUALITATIVE: Preg, Serum: NEGATIVE

## 2023-02-09 LAB — POCT PREGNANCY, URINE: Preg Test, Ur: NEGATIVE

## 2023-02-09 SURGERY — OOPHORECTOMY, LAPAROSCOPIC
Anesthesia: General | Laterality: Left

## 2023-02-09 MED ORDER — FENTANYL CITRATE (PF) 250 MCG/5ML IJ SOLN
INTRAMUSCULAR | Status: AC
  Filled 2023-02-09: qty 5

## 2023-02-09 MED ORDER — IBUPROFEN 800 MG PO TABS: 800.0000 mg | ORAL_TABLET | Freq: Three times a day (TID) | ORAL | 0 refills | Status: AC | PRN

## 2023-02-09 MED ORDER — CLINDAMYCIN PHOSPHATE 900 MG/50ML IV SOLN
900.0000 mg | INTRAVENOUS | Status: AC
  Administered 2023-02-09: 900 mg via INTRAVENOUS

## 2023-02-09 MED ORDER — DEXAMETHASONE SODIUM PHOSPHATE 10 MG/ML IJ SOLN
INTRAMUSCULAR | Status: AC
  Filled 2023-02-09: qty 1

## 2023-02-09 MED ORDER — PROPOFOL 10 MG/ML IV BOLUS
INTRAVENOUS | Status: DC | PRN
  Administered 2023-02-09: 160 mg via INTRAVENOUS

## 2023-02-09 MED ORDER — LIDOCAINE HCL (PF) 2 % IJ SOLN
INTRAMUSCULAR | Status: AC
Start: 2023-02-09 — End: ?
  Filled 2023-02-09: qty 5

## 2023-02-09 MED ORDER — OXYCODONE HCL 5 MG PO TABS: 5.0000 mg | ORAL_TABLET | Freq: Four times a day (QID) | ORAL | 0 refills | Status: AC | PRN

## 2023-02-09 MED ORDER — KETOROLAC TROMETHAMINE 30 MG/ML IJ SOLN
INTRAMUSCULAR | Status: AC
  Filled 2023-02-09: qty 1

## 2023-02-09 MED ORDER — DEXAMETHASONE SODIUM PHOSPHATE 10 MG/ML IJ SOLN
INTRAMUSCULAR | Status: DC | PRN
  Administered 2023-02-09: 10 mg via INTRAVENOUS

## 2023-02-09 MED ORDER — ONDANSETRON HCL 4 MG/2ML IJ SOLN
INTRAMUSCULAR | Status: AC
Start: 2023-02-09 — End: ?
  Filled 2023-02-09: qty 2

## 2023-02-09 MED ORDER — OXYCODONE HCL 5 MG PO TABS
ORAL_TABLET | ORAL | Status: AC
  Filled 2023-02-09: qty 1

## 2023-02-09 MED ORDER — KETOROLAC TROMETHAMINE 30 MG/ML IJ SOLN
INTRAMUSCULAR | Status: DC | PRN
  Administered 2023-02-09: 30 mg via INTRAVENOUS

## 2023-02-09 MED ORDER — KETOROLAC TROMETHAMINE 30 MG/ML IJ SOLN
INTRAMUSCULAR | Status: AC
Start: 2023-02-09 — End: ?
  Filled 2023-02-09: qty 1

## 2023-02-09 MED ORDER — ONDANSETRON HCL 4 MG/2ML IJ SOLN
4.0000 mg | Freq: Once | INTRAMUSCULAR | Status: DC | PRN
Start: 2023-02-09 — End: 2023-02-10

## 2023-02-09 MED ORDER — MIDAZOLAM HCL 2 MG/2ML IJ SOLN
INTRAMUSCULAR | Status: AC
  Filled 2023-02-09: qty 2

## 2023-02-09 MED ORDER — LIDOCAINE 2% (20 MG/ML) 5 ML SYRINGE
INTRAMUSCULAR | Status: DC | PRN
  Administered 2023-02-09: 60 mg via INTRAVENOUS

## 2023-02-09 MED ORDER — BUPIVACAINE HCL (PF) 0.25 % IJ SOLN
INTRAMUSCULAR | Status: DC | PRN
  Administered 2023-02-09: 19 mL

## 2023-02-09 MED ORDER — SUGAMMADEX SODIUM 200 MG/2ML IV SOLN
INTRAVENOUS | Status: DC | PRN
  Administered 2023-02-09: 130 mg via INTRAVENOUS

## 2023-02-09 MED ORDER — POVIDONE-IODINE 10 % EX SWAB: 2.0000 | Freq: Once | CUTANEOUS | Status: DC

## 2023-02-09 MED ORDER — GENTAMICIN SULFATE 40 MG/ML IJ SOLN
5.0000 mg/kg | INTRAVENOUS | Status: AC
  Administered 2023-02-09: 295.2 mg via INTRAVENOUS
  Filled 2023-02-09: qty 7.5

## 2023-02-09 MED ORDER — OXYCODONE HCL 5 MG PO TABS
5.0000 mg | ORAL_TABLET | Freq: Once | ORAL | Status: AC | PRN
  Administered 2023-02-09: 5 mg via ORAL

## 2023-02-09 MED ORDER — DROPERIDOL 2.5 MG/ML IJ SOLN: 0.6250 mg | Freq: Once | INTRAMUSCULAR | Status: DC | PRN

## 2023-02-09 MED ORDER — FENTANYL CITRATE (PF) 100 MCG/2ML IJ SOLN
INTRAMUSCULAR | Status: DC | PRN
  Administered 2023-02-09 (×2): 50 ug via INTRAVENOUS

## 2023-02-09 MED ORDER — ONDANSETRON HCL 4 MG/2ML IJ SOLN
INTRAMUSCULAR | Status: DC | PRN
  Administered 2023-02-09: 4 mg via INTRAVENOUS

## 2023-02-09 MED ORDER — OXYCODONE HCL 5 MG/5ML PO SOLN: 5.0000 mg | Freq: Once | ORAL | Status: AC | PRN

## 2023-02-09 MED ORDER — CLINDAMYCIN PHOSPHATE 900 MG/50ML IV SOLN
INTRAVENOUS | Status: AC
  Filled 2023-02-09: qty 50

## 2023-02-09 MED ORDER — LACTATED RINGERS IV SOLN: INTRAVENOUS | Status: DC

## 2023-02-09 MED ORDER — ROCURONIUM BROMIDE 10 MG/ML (PF) SYRINGE
PREFILLED_SYRINGE | INTRAVENOUS | Status: DC | PRN
  Administered 2023-02-09: 60 mg via INTRAVENOUS
  Administered 2023-02-09: 10 mg via INTRAVENOUS

## 2023-02-09 MED ORDER — ROCURONIUM BROMIDE 10 MG/ML (PF) SYRINGE
PREFILLED_SYRINGE | INTRAVENOUS | Status: AC
  Filled 2023-02-09: qty 10

## 2023-02-09 MED ORDER — HYDROMORPHONE HCL 1 MG/ML IJ SOLN
0.2500 mg | INTRAMUSCULAR | Status: DC | PRN
Start: 2023-02-09 — End: 2023-02-10

## 2023-02-09 MED ORDER — PROPOFOL 10 MG/ML IV BOLUS
INTRAVENOUS | Status: AC
  Filled 2023-02-09: qty 20

## 2023-02-09 MED ORDER — MIDAZOLAM HCL 5 MG/5ML IJ SOLN
INTRAMUSCULAR | Status: DC | PRN
  Administered 2023-02-09: 2 mg via INTRAVENOUS

## 2023-02-09 SURGICAL SUPPLY — 62 items
BAG COUNTER SPONGE SURGICOUNT (BAG) ×1 IMPLANT
BENZOIN TINCTURE PRP APPL 2/3 (GAUZE/BANDAGES/DRESSINGS) ×1 IMPLANT
BLADE SURG 10 STRL SS (BLADE) IMPLANT
CABLE HIGH FREQUENCY MONO STRZ (ELECTRODE) IMPLANT
CANISTER SUCT 3000ML PPV (MISCELLANEOUS) ×1 IMPLANT
CATH ROBINSON RED A/P 16FR (CATHETERS) ×1 IMPLANT
CHLORAPREP W/TINT 26 (MISCELLANEOUS) ×2 IMPLANT
COVER MAYO STAND STRL (DRAPES) ×2 IMPLANT
DERMABOND ADVANCED .7 DNX12 (GAUZE/BANDAGES/DRESSINGS) ×2 IMPLANT
DRAPE SURG IRRIG POUCH 19X23 (DRAPES) ×2 IMPLANT
DRAPE WARM FLUID 44X44 (DRAPES) IMPLANT
DRSG OPSITE POSTOP 4X10 (GAUZE/BANDAGES/DRESSINGS) ×1 IMPLANT
DURAPREP 26ML APPLICATOR (WOUND CARE) ×1 IMPLANT
ELECT REM PT RETURN 9FT ADLT (ELECTROSURGICAL) ×2 IMPLANT
ELECTRODE REM PT RTRN 9FT ADLT (ELECTROSURGICAL) ×2 IMPLANT
GAUZE 4X4 16PLY ~~LOC~~+RFID DBL (SPONGE) ×1 IMPLANT
GLOVE BIOGEL PI IND STRL 6.5 (GLOVE) ×4 IMPLANT
GLOVE BIOGEL PI IND STRL 7.0 (GLOVE) ×4 IMPLANT
GLOVE ECLIPSE 6.0 STRL STRAW (GLOVE) ×2 IMPLANT
GLOVE ECLIPSE 6.5 STRL STRAW (GLOVE) ×2 IMPLANT
GOWN STRL REUS W/ TWL LRG LVL3 (GOWN DISPOSABLE) ×6 IMPLANT
GOWN STRL REUS W/TWL LRG LVL3 (GOWN DISPOSABLE) ×4 IMPLANT
IRRIG SUCT STRYKERFLOW 2 WTIP (MISCELLANEOUS) ×2 IMPLANT
IRRIGATION SUCT STRKRFLW 2 WTP (MISCELLANEOUS) ×2 IMPLANT
KIT PINK PAD W/HEAD ARE REST (MISCELLANEOUS) ×2 IMPLANT
KIT PINK PAD W/HEAD ARM REST (MISCELLANEOUS) ×2 IMPLANT
KIT TURNOVER CYSTO (KITS) ×2 IMPLANT
MANIPULATOR UTERINE 4.5 ZUMI (MISCELLANEOUS) ×2 IMPLANT
NS IRRIG 1000ML POUR BTL (IV SOLUTION) ×2 IMPLANT
PACK ABDOMINAL GYN (CUSTOM PROCEDURE TRAY) ×1 IMPLANT
PACK LAPAROSCOPY BASIN (CUSTOM PROCEDURE TRAY) ×2 IMPLANT
PAD ARMBOARD 7.5X6 YLW CONV (MISCELLANEOUS) ×2 IMPLANT
PAD OB MATERNITY 4.3X12.25 (PERSONAL CARE ITEMS) ×2 IMPLANT
POUCH SPECIMEN RETRIEVAL 10MM (ENDOMECHANICALS) ×1 IMPLANT
PROTECTOR NERVE ULNAR (MISCELLANEOUS) ×2 IMPLANT
RTRCTR C-SECT PINK 25CM LRG (MISCELLANEOUS) IMPLANT
SCISSORS LAP 5X35 DISP (ENDOMECHANICALS) IMPLANT
SCRUB CHG 4% DYNA-HEX 4OZ (MISCELLANEOUS) ×2 IMPLANT
SET TUBE SMOKE EVAC HIGH FLOW (TUBING) ×2 IMPLANT
SHEARS HARMONIC 36 ACE (MISCELLANEOUS) ×1 IMPLANT
SLEEVE SCD COMPRESS KNEE MED (STOCKING) ×2 IMPLANT
SPONGE T-LAP 18X18 ~~LOC~~+RFID (SPONGE) IMPLANT
STRIP CLOSURE SKIN 1/2X4 (GAUZE/BANDAGES/DRESSINGS) ×1 IMPLANT
SUT MON AB 2-0 CT1 36 (SUTURE) IMPLANT
SUT PLAIN 2 0 XLH (SUTURE) IMPLANT
SUT PROLENE 0 CT 1 30 (SUTURE) IMPLANT
SUT VIC AB 0 CT1 18XCR BRD8 (SUTURE) ×2 IMPLANT
SUT VIC AB 0 CT1 27XBRD ANBCTR (SUTURE) ×2 IMPLANT
SUT VIC AB 2-0 CT1 (SUTURE) ×1 IMPLANT
SUT VIC AB 4-0 PS2 18 (SUTURE) ×2 IMPLANT
SUT VIC AB 4-0 PS2 27 (SUTURE) ×2 IMPLANT
SUT VICRYL 0 TIES 12 18 (SUTURE) ×2 IMPLANT
SUT VICRYL 0 UR6 27IN ABS (SUTURE) ×2 IMPLANT
SYR 5ML LL (SYRINGE) ×2 IMPLANT
SYS BAG RETRIEVAL 10MM (BASKET) IMPLANT
SYSTEM BAG RETRIEVAL 10MM (BASKET) IMPLANT
SYSTEM CARTER THOMASON II (TROCAR) ×1 IMPLANT
TOWEL OR 17X24 6PK STRL BLUE (TOWEL DISPOSABLE) ×4 IMPLANT
TRAY FOLEY W/BAG SLVR 14FR LF (SET/KITS/TRAYS/PACK) ×2 IMPLANT
TROCAR Z-THREAD FIOS 11X100 BL (TROCAR) IMPLANT
TROCAR Z-THREAD FIOS 5X100MM (TROCAR) ×4 IMPLANT
WARMER LAPAROSCOPE (MISCELLANEOUS) ×2 IMPLANT

## 2023-02-09 NOTE — Anesthesia Postprocedure Evaluation (Signed)
Anesthesia Post Note  Patient: Bridget Hodges  Procedure(s) Performed: LAPAROSCOPIC OOPHORECTOMY ,Removal of Adnexal Structure (Left)     Patient location during evaluation: PACU Anesthesia Type: General Level of consciousness: awake and alert and oriented Pain management: pain level controlled Vital Signs Assessment: post-procedure vital signs reviewed and stable Respiratory status: spontaneous breathing, nonlabored ventilation and respiratory function stable Cardiovascular status: blood pressure returned to baseline and stable Postop Assessment: no apparent nausea or vomiting Anesthetic complications: no   No notable events documented.  Last Vitals:  Vitals:   02/09/23 1125 02/09/23 1519  BP: 122/88 117/70  Pulse: 78 92  Resp: 17 13  Temp: 36.7 C (!) 36.3 C  SpO2: 99% 100%    Last Pain:  Vitals:   02/09/23 1519  TempSrc:   PainSc: 0-No pain                 [name] A.

## 2023-02-09 NOTE — Anesthesia Procedure Notes (Signed)
Procedure Name: Intubation Date/Time: 02/09/2023 1:25 PM  Performed by: Briant Sites, CRNAPre-anesthesia Checklist: Patient identified, Emergency Drugs available, Suction available and Patient being monitored Patient Re-evaluated:Patient Re-evaluated prior to induction Oxygen Delivery Method: Circle system utilized Preoxygenation: Pre-oxygenation with 100% oxygen Induction Type: IV induction Ventilation: Mask ventilation without difficulty Laryngoscope Size: Mac and 3 Grade View: Grade I Tube type: Oral Tube size: 7.0 mm Number of attempts: 1 Airway Equipment and Method: Stylet and Oral airway Placement Confirmation: ETT inserted through vocal cords under direct vision, positive ETCO2 and breath sounds checked- equal and bilateral Secured at: 20 cm Tube secured with: Tape Dental Injury: Teeth and Oropharynx as per pre-operative assessment

## 2023-02-09 NOTE — Discharge Instructions (Signed)

## 2023-02-09 NOTE — Transfer of Care (Signed)
Immediate Anesthesia Transfer of Care Note  Patient: Bridget Hodges  Procedure(s) Performed: LAPAROSCOPIC OOPHORECTOMY ,Removal of Adnexal Structure (Left)  Patient Location: PACU  Anesthesia Type:General  Level of Consciousness: drowsy  Airway & Oxygen Therapy: Patient Spontanous Breathing and Patient connected to nasal cannula oxygen  Post-op Assessment: Report given to RN and Post -op Vital signs reviewed and stable  Post vital signs: Reviewed and stable  Last Vitals:  Vitals Value Taken Time  BP 117/70   Temp    Pulse 92 02/09/23 1519  Resp 14   SpO2 100 % 02/09/23 1519  Vitals shown include unfiled device data.  Last Pain:  Vitals:   02/09/23 1125  TempSrc: Oral         Complications: No notable events documented.

## 2023-02-09 NOTE — H&P (Signed)
Bridget Hodges is an 32 y.o. female G2P1 with 8cm right adnexal mass, c/w dermoid. Large component is cystic. Negative tumor markers.  Pertinent Gynecological History:  Menstrual History: Patient's last menstrual period was 01/23/2023 (exact date).    Past Medical History:  Diagnosis Date   Asthma    Thrombocytopenia (HCC)    Vacuum extraction, delivered, current hospitalization 09/28/2014    Past Surgical History:  Procedure Laterality Date   CYST REMOVAL HAND Left     Family History  Problem Relation Age of Onset   Cancer Maternal Aunt 9       breast cancer    Healthy Mother    Healthy Father    Healthy Maternal Grandmother    Healthy Maternal Grandfather    Healthy Paternal Grandmother    Healthy Paternal Grandfather     Social History:  reports that she has never smoked. She has never used smokeless tobacco. She reports current alcohol use. She reports current drug use. Drug: Marijuana.  Allergies:  Allergies  Allergen Reactions   Penicillins Rash    Medications Prior to Admission  Medication Sig Dispense Refill Last Dose   ferrous sulfate 325 (65 FE) MG tablet Take 1 tablet (325 mg total) by mouth 2 (two) times daily with a meal. 60 tablet 3 02/08/2023   ibuprofen (ADVIL,MOTRIN) 600 MG tablet Take 1 tablet (600 mg total) by mouth every 6 (six) hours as needed. 30 tablet 0 Past Week   ondansetron (ZOFRAN) 4 MG tablet Take 1 tablet (4 mg total) by mouth every 6 (six) hours. 12 tablet 0 Past Week   albuterol (PROVENTIL HFA;VENTOLIN HFA) 108 (90 BASE) MCG/ACT inhaler Inhale 2 puffs into the lungs every 6 (six) hours as needed for wheezing or shortness of breath. 1 Inhaler 3 Unknown   calcium carbonate (TUMS - DOSED IN MG ELEMENTAL CALCIUM) 500 MG chewable tablet Chew 1 tablet by mouth daily as needed for indigestion or heartburn.   Unknown   Prenatal Vit-Fe Fumarate-FA (PRENATAL MULTIVITAMIN) TABS tablet Take 1 tablet by mouth daily.        ROS SOB/chest  pain/ HA/ vision changes/ LE pain or swelling/ etc.  Blood pressure 122/88, pulse 78, temperature 98.1 F (36.7 C), temperature source Oral, resp. rate 17, height 4' 9.75" (1.467 m), weight 64 kg, last menstrual period 01/23/2023, SpO2 99%, not currently breastfeeding. Physical Exam A&O x 3 HEENT grossly wnl Lungs ctab CV rrr Abdo soft,nt,nd Extr no edema,nt bilat Pelvic : deferred   Results for orders placed or performed during the hospital encounter of 02/09/23 (from the past 24 hour(s))  Type and screen     Status: None (Preliminary result)   Collection Time: 02/09/23 11:40 AM  Result Value Ref Range   ABO/RH(D) PENDING    Antibody Screen PENDING    Sample Expiration      02/12/2023,2359 Performed at [facility], [address], [place], [place] [address]   I-STAT, [place]     Status: None   Collection Time: 02/09/23 11:45 AM  Result Value Ref Range   Sodium 137 135 - 145 mmol/L   Potassium 3.8 3.5 - 5.1 mmol/L   Chloride 104 98 - 111 mmol/L   BUN 12 6 - 20 mg/dL   Creatinine, Ser 6.38 0.44 - 1.00 mg/dL   Glucose, Bld 86 70 - 99 mg/dL   Calcium, Ion 7.56 4.33 - 1.40 mmol/L   TCO2 23 22 - 32 mmol/L   Hemoglobin 13.9 12.0 - 15.0  g/dL   HCT 56.2 13.0 - 86.5 %   Upt neg No results found.  Assessment/Plan: 32 y/o G2P1 Right ovarian mass - findings c/w dermoid; patient understands plan for l/s right oopohorectomy d/t size of dermoid. May want future fertility. Risk of bleeding, infection, risk of injury to bowel/bladder/nerves/blood vessels, risk of further surgery, risk of blood clot to leg/lung, risk of anesthesia. Consent signed. Proceed to OR  Vick Frees 02/09/2023, 12:40 PM

## 2023-02-09 NOTE — Anesthesia Preprocedure Evaluation (Addendum)
Anesthesia Evaluation  Patient identified by MRN, date of birth, ID band Patient awake    Reviewed: Allergy & Precautions, NPO status , Patient's Chart, lab work & pertinent test results  Airway Mallampati: II       Dental no notable dental hx. (+) Dental Advisory Given, Teeth Intact   Pulmonary asthma    Pulmonary exam normal breath sounds clear to auscultation       Cardiovascular negative cardio ROS Normal cardiovascular exam Rhythm:Regular Rate:Normal     Neuro/Psych negative neurological ROS  negative psych ROS   GI/Hepatic negative GI ROS,,,(+) Hepatitis -, BHep B sAg +   Endo/Other  Hypothyroidism    Renal/GU negative Renal ROS  negative genitourinary   Musculoskeletal negative musculoskeletal ROS (+)    Abdominal   Peds  Hematology Thrombocytopenia- mild   Anesthesia Other Findings   Reproductive/Obstetrics Right adnexal mass- suspected dermoid cyst                              Anesthesia Physical Anesthesia Plan  ASA: 2  Anesthesia Plan: General   Post-op Pain Management: Minimal or no pain anticipated, Dilaudid IV, Tylenol PO (pre-op)* and Precedex   Induction: Intravenous  PONV Risk Score and Plan: 4 or greater and Treatment may vary due to age or medical condition, Midazolam, Scopolamine patch - Pre-op, Ondansetron and Dexamethasone  Airway Management Planned: Oral ETT  Additional Equipment: None  Intra-op Plan:   Post-operative Plan: Extubation in OR  Informed Consent: I have reviewed the patients History and Physical, chart, labs and discussed the procedure including the risks, benefits and alternatives for the proposed anesthesia with the patient or authorized representative who has indicated his/her understanding and acceptance.     Dental advisory given  Plan Discussed with: Anesthesiologist and CRNA  Anesthesia Plan Comments:           Anesthesia Quick Evaluation

## 2023-02-10 ENCOUNTER — Encounter (HOSPITAL_BASED_OUTPATIENT_CLINIC_OR_DEPARTMENT_OTHER): Payer: Self-pay | Admitting: Obstetrics and Gynecology

## 2023-02-11 LAB — SURGICAL PATHOLOGY

## 2023-02-12 NOTE — Op Note (Signed)
Preoperative diagnosis: right dermoid cyst Postoperative diagnosis: Same Procedure: laparoscopic left oophorectomy Surgeon: Rhoderick Moody, MD Assistants: Shea Evans, MD Anesthesia Gen. Endotracheal IV fluids LR: 1 liter EBL: 5ml Urine: 50ml Complications none Disposition PACU and home Specimens: left ovary with dermoid cyst  Procedure Patient seen in pre-op, planning l/s oophorectomy d/t large right ovarian dermoid noted on imaging.   Risk and complications of surgery were reviewed. Patient voiced understanding.  Informed written consent was obtained and patient was brought to the operating room. Timeout was carried out. She underwent general anesthesia without difficulty and was given dorsal lithotomy position with arms tucked. Examination under anesthesia revealed anteverted 8 week size uterus. The patient was prepped and draped in standard fashion. Foley was placed. Speculum was placed anterior lip of cervix was grasped with tenaculum, uterus was sounded to 80 cm and was anteverted. A Zumi manipulator was introduced in the uterine cavity and secured with balloon.   Gloves were changed attention was focused on the abdomen. A 5 mm vertical incision was made at the umbilicus after injecting 10 cc Marcaine. The abdomen was then entered under direct visualization with optiview and w/o difficulty. The abdomen was then insuflated after noting and opening pressure of . The abdomen was insuflated with C02 gas until pressure of . Right and left lower quadrant ports were placed in similar fashion. The left incision was made to allow advancement of a 10mm trocar. These trocars were advanced under visualization. The attention was then placed to the pelvis and the patient placed in [place]. The left ovary was noted to be enlarged and twisted, resting in the anterior culdesac with large cystic components. The ovary extended into the right lower quadrant.  The ovary was then flipped back to  its normal position w/o difficulty. The right ovary and tube were noted to be wnl. The left tube was wnl.  Attention was then drawn to the left ovary where incision was made in the cystic area with the Harmonic scalpel. Clear fluid was drained. The incision was then extended with the harmonic scalpel to try to enter the dermoid, but there appeared to be a separate cyst, so then another incision was made and hair and other material was removed from the cyst.  Attempt was made to identify the cyst wall and save the ovary, but this was difficult and no clear wall identified.  Attempt then made to keep an ovarian remnant but the dermoid cyst was close to IP and decision made to remove the ovary entirely. This was done with the harmonic scalpel and excellent hemostasis noted. An endopouch was then advanced intho the abdomen and the cyst placed into the pouch. This was then brought to the abdominal wall. The camara was removed and attention was then back at the abdomen were the cystic contents were morcilated through the bag and contents removed until pouch able to be brought through the incision. The trocar was then replaced under visualization. The pelvis was irritated and the pedicle was noted to still be hemostatic  All instruments were removed, pneumoperitoneum was released. The skin incisions were closed with 4-0 vicryl and Dermabond was applied at the incision. The ZUMI manipulator was removed from the uterus. Hemostasis was excellent. All instruments were removed.   All counts were correct x2 patient was reversed from general anesthesia extubated and brought out to the recovery room in stable condition. Plan is to discharge home from recovery room. Surgical findings were discussed with patient's family.
